# Patient Record
Sex: Male | Born: 1990 | Race: Black or African American | Hispanic: No | Marital: Single | Smoking: Former smoker
Health system: Southern US, Community
[De-identification: ages and names within clinical notes are randomized; demographics above are authoritative.]

## PROBLEM LIST (undated history)

## (undated) HISTORY — PX: ABDOMINAL SURGERY: SHX537

## (undated) HISTORY — PX: HERNIA REPAIR: SHX51

---
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
## 2009-11-07 ENCOUNTER — Emergency Department (HOSPITAL_COMMUNITY): Admission: EM | Admit: 2009-11-07 | Discharge: 2009-11-07 | Payer: Self-pay | Admitting: Family Medicine

## 2009-12-02 ENCOUNTER — Emergency Department (HOSPITAL_COMMUNITY): Admission: EM | Admit: 2009-12-02 | Discharge: 2009-12-02 | Payer: Self-pay | Admitting: Family Medicine

## 2010-02-20 ENCOUNTER — Emergency Department (HOSPITAL_COMMUNITY)
Admission: EM | Admit: 2010-02-20 | Discharge: 2010-02-20 | Payer: Self-pay | Source: Home / Self Care | Admitting: Emergency Medicine

## 2010-04-24 ENCOUNTER — Emergency Department (HOSPITAL_COMMUNITY): Payer: Self-pay

## 2010-04-24 ENCOUNTER — Emergency Department (HOSPITAL_COMMUNITY)
Admission: EM | Admit: 2010-04-24 | Discharge: 2010-04-25 | Disposition: A | Discharge status: Home / Self Care | Payer: Self-pay | Attending: Emergency Medicine | Admitting: Emergency Medicine

## 2010-04-24 DIAGNOSIS — S59909A Unspecified injury of unspecified elbow, initial encounter: Secondary | ICD-10-CM | POA: Insufficient documentation

## 2010-04-24 DIAGNOSIS — S6990XA Unspecified injury of unspecified wrist, hand and finger(s), initial encounter: Secondary | ICD-10-CM | POA: Insufficient documentation

## 2010-04-24 DIAGNOSIS — K219 Gastro-esophageal reflux disease without esophagitis: Secondary | ICD-10-CM | POA: Insufficient documentation

## 2010-04-24 DIAGNOSIS — Y92009 Unspecified place in unspecified non-institutional (private) residence as the place of occurrence of the external cause: Secondary | ICD-10-CM | POA: Insufficient documentation

## 2010-04-24 DIAGNOSIS — IMO0002 Reserved for concepts with insufficient information to code with codable children: Secondary | ICD-10-CM | POA: Insufficient documentation

## 2010-04-24 DIAGNOSIS — S5010XA Contusion of unspecified forearm, initial encounter: Secondary | ICD-10-CM | POA: Insufficient documentation

## 2010-04-28 LAB — GC/CHLAMYDIA PROBE AMP, URINE: Chlamydia, Swab/Urine, PCR: NEGATIVE

## 2010-05-25 LAB — GC/CHLAMYDIA PROBE AMP, GENITAL
Chlamydia, DNA Probe: NEGATIVE
GC Probe Amp, Genital: POSITIVE — AB

## 2010-06-28 NOTE — Op Note (Signed)
NAME:  Aaron Scott, Aaron Scott NO.:  [id number]   MEDICAL RECORD NO.:  [id number]           PATIENT TYPE:   LOCATION:                                 FACILITY:   PHYSICIAN:  Jon Gills, M.D.  DATE OF BIRTH:  [birth year]   DATE OF PROCEDURE:  05/17/2007  DATE OF DISCHARGE:                               OPERATIVE REPORT   PREOPERATIVE DIAGNOSIS:  Abdominal and chest pain.   POSTOPERATIVE DIAGNOSIS:  Nodular gastritis.   SURGEON:  Jon Gills, M.D.   ASSISTANT:  None.   DESCRIPTION OF PROCEDURE:  Following informed written consent, the  patient was taken to the operating room and placed under general  anesthesia with continuous cardiopulmonary monitoring.  Pentax upper GI  endoscope was passed by mouth and advanced without difficulty.  A  competent lower esophageal sphincter was present 40 cm from the  incisors.  There was no visual evidence for esophagitis, duodenitis, or  peptic ulcer disease.  Moderate nodularity was present in the gastric  antrum.  A solitary gastric biopsy was positive for Helicobacter by CLO  testing.  Multiple esophageal and duodenal biopsies were normal.  Gastric biopsies confirmed the presence of Helicobacter organisms.  The  endoscope was gradually withdrawn and the patient was awakened, taken to  recovery room in satisfactory condition.  He will be released later  today to the care of his family.  He was treated with a 10-day course of  Prevpac for his Helicobacter gastritis.   DESCRIPTION OF TECHNICAL PROCEDURE USED:  Pentax upper GI endoscope with  cold biopsy forceps.   DESCRIPTION OF SPECIMENS REMOVED:  Esophagus x3 in formalin, gastric x1  for CLO testing, gastric x3 in formalin, and duodenum x3 in formalin.           ______________________________  Jon Gills, M.D.     [facility]  D:  06/13/2007  T:  06/14/2007  Job:  [id number]   cc:   Fleet Contras, MD

## 2010-07-19 ENCOUNTER — Emergency Department (HOSPITAL_COMMUNITY): Payer: Self-pay

## 2010-07-19 ENCOUNTER — Inpatient Hospital Stay (HOSPITAL_COMMUNITY)
Admission: EM | Admit: 2010-07-19 | Discharge: 2010-07-21 | DRG: 603 | Disposition: A | Discharge status: Home / Self Care | Payer: Self-pay | Source: Ambulatory Visit | Attending: Orthopedic Surgery | Admitting: Orthopedic Surgery

## 2010-07-19 DIAGNOSIS — Z91040 Latex allergy status: Secondary | ICD-10-CM

## 2010-07-19 DIAGNOSIS — L02519 Cutaneous abscess of unspecified hand: Principal | ICD-10-CM | POA: Diagnosis present

## 2010-07-19 DIAGNOSIS — F1210 Cannabis abuse, uncomplicated: Secondary | ICD-10-CM | POA: Diagnosis present

## 2010-07-19 DIAGNOSIS — L03019 Cellulitis of unspecified finger: Principal | ICD-10-CM | POA: Diagnosis present

## 2010-07-19 DIAGNOSIS — F17200 Nicotine dependence, unspecified, uncomplicated: Secondary | ICD-10-CM | POA: Diagnosis present

## 2010-07-19 DIAGNOSIS — K219 Gastro-esophageal reflux disease without esophagitis: Secondary | ICD-10-CM | POA: Diagnosis present

## 2010-07-19 DIAGNOSIS — Z8614 Personal history of Methicillin resistant Staphylococcus aureus infection: Secondary | ICD-10-CM

## 2010-07-20 LAB — BASIC METABOLIC PANEL
BUN: 13 mg/dL (ref 6–23)
Calcium: 9.2 mg/dL (ref 8.4–10.5)
Chloride: 102 mEq/L (ref 96–112)
GFR calc Af Amer: >60 mL/min (ref >60)
Potassium: 3.9 mEq/L (ref 3.5–5.1)
Sodium: 136 mEq/L (ref 135–145)

## 2010-07-22 LAB — WOUND CULTURE

## 2010-08-01 NOTE — Op Note (Signed)
NAME:  Aaron, Scott NO.:  [id number]  MEDICAL RECORD NO.:  [id number]  LOCATION:  5002                         FACILITY:  [facility]  PHYSICIAN:  Aaron Scott, ScottDATE OF BIRTH:  [birth year]  DATE OF PROCEDURE: DATE OF DISCHARGE:                              OPERATIVE REPORT   I was asked to see Aaron Scott in regard to his upper extremity predicament.  This patient is a 20 year old male with a notable history of a left middle finger swelling and what appears to be an abscess. This started 2 days ago but cumulated in pain, difficulty, and problems functioning.  The patient and I discussed his upper extremity predicament options for treatment and I should note that he has had a history of boils according to his report.  He notes no history of obvious MRSA infection he is aware of.  PAST MEDICAL HISTORY:  None significant.  PAST SURGICAL HISTORY:  Hernia repair and endoscopy.  ALLERGIES:  LATEX.  MEDICINES:  None.  He smokes cigarettes and occasionally drinks alcohol.  He is here today with his girlfriend.  FAMILY HISTORY:  Noncontributory.  REVIEW OF SYSTEMS:  Negative other than the notable history of boils according to his report and the current upper extremity predicament involving his left middle finger.  PHYSICAL EXAMINATION:  HEENT:  Within normal limits. CHEST:  Clear. HEART:  Regular rate. ABDOMEN:  Nontender. EXTREMITIES:  Right upper extremity is neurovascularly intact.  Lower extremity examination is benign with normal neurovascular examination. No signs of infection, dystrophy, and no DVT.  The patient's left hand shows a swollen middle finger.  He has been numbed up by emergency room staff already, but does not have obvious Kanavel signs at this juncture. He has a callused area over the DIP crease volarly and this appears to be the area where his abnormality is most significant.  I have reviewed this with him at length and  the findings.  At the present time, he demonstrates intact flexor and extensor tendon function, but certainly is sore throughout the finger with erythema and cellulitis.  IMPRESSION:  Abscess, left middle finger.  PLAN:  I have consented him for I and D.  He was taken to the procedure suite and underwent a further block by myself followed by 2 Betadine scrubs followed by tourniquet inflation followed by I and D of the deep abscess.  This was performed without difficulty.  I and D of the deep abscess volarly about the middle finger was accomplished.  I did not go into the flexor sheath as the abscess did not appear to encroach upon this.  Nevertheless, I am going to watch this closely for any signs of seeding secondarily.  The wound was lavaged, packed with Iodoform gauze.  He was dressed sterilely. Following this, I discussed with him admission to the hospital, my plan for vancomycin IV, Unasyn, await his cultures which were taken with the deep abscess I and D and monitor his condition closely.  Should any worsening or problems arise, he will notify me.  It was an absolute pleasure to see him today.  Should any problems occur, he is going to notify me.  These notes have been discussed.  I  do feel that the best option for treatment is going to be an aggressive approach.  These notes have been discussed and all questions have been encouraged and answered.     Aaron Ano. Aaron Scott, M.D.     [facility]  D:  07/20/2010  T:  07/20/2010  Job:  [id number]  Electronically Signed by Aaron Scott 06:33:34 AM

## 2010-11-04 LAB — DIFFERENTIAL
Basophils Absolute: 0
Eosinophils Absolute: 0.3
Lymphocytes Relative: 30

## 2010-11-04 LAB — COMPREHENSIVE METABOLIC PANEL
ALT: 22
AST: 31
BUN: 12
CO2: 27
Creatinine, Ser: 0.94
Total Protein: 7.4

## 2010-11-04 LAB — CBC
Hemoglobin: 14.8
MCV: 83.9
RDW: 12.8

## 2010-11-07 LAB — URINALYSIS, ROUTINE W REFLEX MICROSCOPIC
Bilirubin Urine: NEGATIVE
Glucose, UA: NEGATIVE
Hgb urine dipstick: NEGATIVE
Hgb urine dipstick: NEGATIVE
Ketones, ur: NEGATIVE
Nitrite: NEGATIVE
Nitrite: NEGATIVE
Nitrite: NEGATIVE
Protein, ur: NEGATIVE
Specific Gravity, Urine: 1.007
Specific Gravity, Urine: 1.035 — ABNORMAL HIGH
Urobilinogen, UA: 1
pH: 6.5
pH: 7.5

## 2010-11-07 LAB — COMPREHENSIVE METABOLIC PANEL
ALT: 84 — ABNORMAL HIGH
AST: 141 — ABNORMAL HIGH
Alkaline Phosphatase: 83
CO2: 27
Calcium: 9.4
Chloride: 103
Glucose, Bld: 93
Sodium: 136
Total Bilirubin: 1

## 2010-11-07 LAB — HIV ANTIBODY (ROUTINE TESTING W REFLEX): HIV: NONREACTIVE

## 2010-11-07 LAB — DIFFERENTIAL
Basophils Absolute: 0
Basophils Absolute: 0
Basophils Relative: 0
Eosinophils Absolute: 0.1
Eosinophils Relative: 2
Lymphocytes Relative: 32
Lymphs Abs: 3.8
Monocytes Absolute: 0.9
Monocytes Relative: 16 — ABNORMAL HIGH
Neutro Abs: 2.9
Neutrophils Relative %: 48
Neutrophils Relative %: 36 — ABNORMAL LOW

## 2010-11-07 LAB — POCT CARDIAC MARKERS
CKMB, poc: 10.4
CKMB, poc: 11.6
Myoglobin, poc: 327
Operator id: 146091
Operator id: 270651
Troponin i, poc: <0.05

## 2010-11-07 LAB — BASIC METABOLIC PANEL
Calcium: 9.5
Sodium: 137

## 2010-11-07 LAB — D-DIMER, QUANTITATIVE: D-Dimer, Quant: 0.68 — ABNORMAL HIGH

## 2010-11-07 LAB — CBC
HCT: 39.8
Hemoglobin: 15
Hemoglobin: 13.4
Hemoglobin: 14.4
MCHC: 33.7
MCHC: 34.2
Platelets: 315
RBC: 5.25
RBC: 5.18
RDW: 12.3
WBC: 6.9

## 2010-11-07 LAB — RAPID URINE DRUG SCREEN, HOSP PERFORMED
Cocaine: NOT DETECTED
Tetrahydrocannabinol: NOT DETECTED

## 2010-11-07 LAB — MONONUCLEOSIS SCREEN: Mono Screen: NEGATIVE

## 2010-11-07 LAB — CK: Total CK: 2210 — ABNORMAL HIGH

## 2010-11-07 LAB — SEDIMENTATION RATE: Sed Rate: 4

## 2010-11-18 LAB — URINE MICROSCOPIC-ADD ON

## 2010-11-18 LAB — DIFFERENTIAL
Eosinophils Relative: 2 % (ref 0–5)
Lymphocytes Relative: 31 % (ref 12–46)
Lymphs Abs: 2.8 10*3/uL (ref 0.7–4.0)
Monocytes Absolute: 0.6 10*3/uL (ref 0.1–1.0)
Monocytes Relative: 7 % (ref 3–12)

## 2010-11-18 LAB — URINALYSIS, ROUTINE W REFLEX MICROSCOPIC
Glucose, UA: NEGATIVE mg/dL
Hgb urine dipstick: NEGATIVE
Specific Gravity, Urine: 1.033 — ABNORMAL HIGH (ref 1.005–1.030)
Urobilinogen, UA: 1 mg/dL (ref 0.0–1.0)

## 2010-11-18 LAB — CBC
MCHC: 33.8 g/dL (ref 30.0–36.0)
MCV: 81.3 fL (ref 78.0–100.0)
Platelets: 322 10*3/uL (ref 150–400)

## 2010-11-18 LAB — COMPREHENSIVE METABOLIC PANEL
ALT: 22 U/L (ref 0–53)
AST: 34 U/L (ref 0–37)
Albumin: 3.9 g/dL (ref 3.5–5.2)
Calcium: 9.4 mg/dL (ref 8.4–10.5)
Creatinine, Ser: 0.95 mg/dL (ref 0.4–1.5)
GFR calc Af Amer: >60 mL/min (ref >60)
Sodium: 140 mEq/L (ref 135–145)
Total Protein: 7.3 g/dL (ref 6.0–8.3)

## 2010-12-09 ENCOUNTER — Emergency Department (HOSPITAL_COMMUNITY)
Admission: EM | Admit: 2010-12-09 | Discharge: 2010-12-09 | Disposition: A | Discharge status: Home / Self Care | Payer: Self-pay | Attending: Emergency Medicine | Admitting: Emergency Medicine

## 2010-12-09 DIAGNOSIS — K219 Gastro-esophageal reflux disease without esophagitis: Secondary | ICD-10-CM | POA: Insufficient documentation

## 2010-12-09 DIAGNOSIS — R21 Rash and other nonspecific skin eruption: Secondary | ICD-10-CM | POA: Insufficient documentation

## 2010-12-09 DIAGNOSIS — A4902 Methicillin resistant Staphylococcus aureus infection, unspecified site: Secondary | ICD-10-CM | POA: Insufficient documentation

## 2011-01-17 ENCOUNTER — Encounter: Payer: Self-pay | Admitting: Emergency Medicine

## 2011-01-17 ENCOUNTER — Emergency Department (HOSPITAL_COMMUNITY): Payer: Self-pay

## 2011-01-17 ENCOUNTER — Emergency Department (HOSPITAL_COMMUNITY)
Admission: EM | Admit: 2011-01-17 | Discharge: 2011-01-17 | Disposition: A | Discharge status: Home / Self Care | Payer: Self-pay | Attending: Emergency Medicine | Admitting: Emergency Medicine

## 2011-01-17 DIAGNOSIS — W219XXA Striking against or struck by unspecified sports equipment, initial encounter: Secondary | ICD-10-CM | POA: Insufficient documentation

## 2011-01-17 DIAGNOSIS — M7989 Other specified soft tissue disorders: Secondary | ICD-10-CM | POA: Insufficient documentation

## 2011-01-17 DIAGNOSIS — S62309A Unspecified fracture of unspecified metacarpal bone, initial encounter for closed fracture: Secondary | ICD-10-CM | POA: Insufficient documentation

## 2011-01-17 DIAGNOSIS — Y9371 Activity, boxing: Secondary | ICD-10-CM | POA: Insufficient documentation

## 2011-01-17 MED ORDER — OXYCODONE-ACETAMINOPHEN 5-325 MG PO TABS
2.0000 | ORAL_TABLET | Freq: Once | ORAL | Status: AC
  Administered 2011-01-17: 2 via ORAL
  Filled 2011-01-17: qty 2

## 2011-01-17 MED ORDER — OXYCODONE-ACETAMINOPHEN 5-325 MG PO TABS: 2.0000 | ORAL_TABLET | Freq: Once | ORAL | Status: AC

## 2011-01-17 NOTE — ED Notes (Signed)
Cletis Athens, ortho tech at bedside.

## 2011-01-17 NOTE — ED Notes (Signed)
AC contacted, Ortho Tech paged by the secretary, waiting for the arrival

## 2011-01-17 NOTE — ED Notes (Signed)
Pt states that he was "playing around" boxing with the friend, pt states that he thinks that arm is broken since this happened before. Noted swelling to the right hand, pt able to move all digits, however, last 3 with difficulty, no deformity noted at this time, pain level 10/10. Pulses present even and regular bilaterally and cap ref <3

## 2011-01-17 NOTE — ED Notes (Signed)
Ortho tech arrived, pt informed

## 2011-01-17 NOTE — ED Provider Notes (Signed)
History     CSN: [id number] Arrival date & time: 01/17/2011  2:10 AM   First MD Initiated Contact with Patient 01/17/11 0457      Chief Complaint  Patient presents with  . Hand Injury    right  . Recurrent Skin Infections    (Consider location/radiation/quality/duration/timing/severity/associated sxs/prior treatment) Patient is a 20 y.o. male presenting with hand injury. The history is provided by the patient.  Hand Injury  The incident occurred 6 to 12 hours ago. The incident occurred at home. Injury mechanism: boxing with a friend and sustained a right hand iinjury. The pain is present in the right hand. The pain is moderate. The pain has been constant since the incident. Pertinent negatives include no fever. He reports no foreign bodies present. The symptoms are aggravated by movement. He has tried nothing for the symptoms.   no radiation of pain. Sharp in quality. Hurts to move or touch that area over the dorsum of his hand. No alleviating factors  History reviewed. No pertinent past medical history.  Past Surgical History  Procedure Date  . Abdominal surgery     History reviewed. No pertinent family history.  History  Substance Use Topics  . Smoking status: Current Some Day Smoker  . Smokeless tobacco: Not on file  . Alcohol Use: No      Review of Systems  Constitutional: Negative for fever and chills.  HENT: Negative for neck pain and neck stiffness.   Eyes: Negative for pain.  Respiratory: Negative for shortness of breath.   Cardiovascular: Negative for chest pain.  Gastrointestinal: Negative for abdominal pain.  Genitourinary: Negative for dysuria.  Musculoskeletal: Positive for joint swelling. Negative for back pain.  Skin: Negative for rash.  Neurological: Negative for headaches.  All other systems reviewed and are negative.    Allergies  Review of patient's allergies indicates no known allergies.  Home Medications  No current outpatient  prescriptions on file.  BP 120/55  Pulse 69  Temp(Src) 98.6 F (37 C) (Oral)  Resp 18  SpO2 98%  Physical Exam  Constitutional: He is oriented to person, place, and time. He appears well-developed and well-nourished.  HENT:  Head: Normocephalic and atraumatic.  Eyes: Conjunctivae and EOM are normal. Pupils are equal, round, and reactive to light.  Neck: Full passive range of motion without pain. Neck supple.  Cardiovascular: Normal rate, regular rhythm, S1 normal, S2 normal and intact distal pulses.   Pulmonary/Chest: Effort normal and breath sounds normal.  Abdominal: Soft. Bowel sounds are normal. There is no tenderness. There is no CVA tenderness.  Musculoskeletal:       Right upper extremity: Tenderness to palpation with swelling over the dorsum of the hand over the fourth carpal bone. Distal neurovascular is intact with mildly decreased range of motion making a fist secondary to pain. Skin is intact. Nontender over the wrist and elbow  Neurological: He is alert and oriented to person, place, and time. He has normal strength and normal reflexes. No cranial nerve deficit or sensory deficit. He displays a negative Romberg sign. GCS eye subscore is 4. GCS verbal subscore is 5. GCS motor subscore is 6.       Normal Gait  Skin: Skin is warm and dry. No rash noted. No cyanosis. Nails show no clubbing.  Psychiatric: He has a normal mood and affect. His speech is normal and behavior is normal.    ED Course  Procedures (including critical care time)  Labs Reviewed - No data to  display Dg Hand Complete Right  01/17/2011  *RADIOLOGY REPORT*  Clinical Data: Injured right hand while boxing, with pain at the fourth and fifth metacarpals, and associated soft tissue swelling.  RIGHT HAND - COMPLETE 3+ VIEW  Comparison: Right hand radiograph performed 06/07/2005  Findings: There is a minimally displaced fracture through the mid- diaphysis of the fourth metacarpal.  Slight volar tilt is noted. No  additional fractures are seen.  Overlying soft tissue swelling is noted.  The joint spaces are preserved.  The carpal rows are intact, and demonstrate normal alignment.  IMPRESSION: Minimally displaced fracture through the mid diaphysis of the fourth metacarpal, with slight volar tilt.  Original Report Authenticated By: Tonia Ghent, M.D.    Ice, pain control and imaging   MDM   Splint and hand referral for fracture as above.       Sunnie Nielsen, MD 01/17/11 [phone]

## 2011-01-17 NOTE — ED Notes (Signed)
Pt and family are apset since that delay is to long, pt wanted to speak with [facility], notified, AC at the bedside, pt complain is that Xray is taking to long to be read. Pt informe, verbalizing understanding.

## 2011-01-20 ENCOUNTER — Emergency Department (HOSPITAL_COMMUNITY)
Admission: EM | Admit: 2011-01-20 | Discharge: 2011-01-21 | Disposition: A | Discharge status: Home / Self Care | Payer: Self-pay | Attending: Emergency Medicine | Admitting: Emergency Medicine

## 2011-01-20 ENCOUNTER — Encounter (HOSPITAL_COMMUNITY): Payer: Self-pay | Admitting: Physical Medicine and Rehabilitation

## 2011-01-20 DIAGNOSIS — F17200 Nicotine dependence, unspecified, uncomplicated: Secondary | ICD-10-CM | POA: Insufficient documentation

## 2011-01-20 DIAGNOSIS — J020 Streptococcal pharyngitis: Secondary | ICD-10-CM | POA: Insufficient documentation

## 2011-01-20 DIAGNOSIS — R599 Enlarged lymph nodes, unspecified: Secondary | ICD-10-CM | POA: Insufficient documentation

## 2011-01-20 NOTE — ED Notes (Signed)
Pt presents to department for evaluation of sore throat. Ongoing x2 days. Pt states pain became worse today. Pain with swallowing and eating. States "I feel like something is in my throat." pt also states generalized fatigue. He is alert and oriented x4. No signs of distress at the time.

## 2011-01-21 ENCOUNTER — Encounter (HOSPITAL_COMMUNITY): Payer: Self-pay | Admitting: *Deleted

## 2011-01-21 MED ORDER — PREDNISONE 20 MG PO TABS
60.0000 mg | ORAL_TABLET | Freq: Once | ORAL | Status: AC
Start: 2011-01-21 — End: 2011-01-21
  Administered 2011-01-21: 60 mg via ORAL
  Filled 2011-01-21: qty 3

## 2011-01-21 MED ORDER — GI COCKTAIL ~~LOC~~
30.0000 mL | Freq: Once | ORAL | Status: AC
  Administered 2011-01-21: 30 mL via ORAL
  Filled 2011-01-21: qty 30

## 2011-01-21 MED ORDER — PENICILLIN G BENZATHINE & PROC 1200000 UNIT/2ML IM SUSP
1.2000 10*6.[IU] | Freq: Once | INTRAMUSCULAR | Status: AC
  Administered 2011-01-21: 1.2 10*6.[IU] via INTRAMUSCULAR
  Filled 2011-01-21: qty 2

## 2011-01-21 NOTE — ED Provider Notes (Signed)
Medical screening examination/treatment/procedure(s) were performed by non-physician practitioner and as supervising physician I was immediately available for consultation/collaboration.   Glynn Octave, MD 01/21/11 [phone]

## 2011-01-21 NOTE — ED Provider Notes (Signed)
History     CSN: [id number] Arrival date & time: 01/20/2011  9:46 PM   First MD Initiated Contact with Patient 01/21/11 0135      Chief Complaint  Patient presents with  . Sore Throat    HPI  History provided by the patient and significant other. Patient presents with complaints of increasing sore throat for the past 2 days. Patient denies cough, congestion, rhinorrhea, difficulty swallowing, difficulty breathing, nausea or vomiting. Patient does not report any known sick contacts. Patient denies any alleviating factors.  Pain is worse with swallowing. Patient has no other significant past medical history.  History reviewed. No pertinent past medical history.  Past Surgical History  Procedure Date  . Abdominal surgery     History reviewed. No pertinent family history.  History  Substance Use Topics  . Smoking status: Current Some Day Smoker  . Smokeless tobacco: Not on file  . Alcohol Use: No      Review of Systems  Constitutional: Negative for fever and chills.  HENT: Positive for sore throat. Negative for nosebleeds, congestion and dental problem.   Respiratory: Negative for cough and shortness of breath.   Gastrointestinal: Negative for nausea, vomiting, diarrhea and constipation.  All other systems reviewed and are negative.    Allergies  Latex  Home Medications   Current Outpatient Rx  Name Route Sig Dispense Refill  . OXYCODONE-ACETAMINOPHEN 5-325 MG PO TABS Oral Take 2 tablets by mouth once. 15 tablet 0    BP 130/79  Pulse 99  Temp(Src) 98.6 F (37 C) (Oral)  Resp 18  SpO2 99%  Physical Exam  Nursing note and vitals reviewed. Constitutional: He is oriented to person, place, and time. He appears well-developed and well-nourished. No distress.  HENT:  Head: Normocephalic.  Right Ear: External ear normal.  Left Ear: External ear normal.       Tonsils enlarged bilaterally with erythema and small amount of exudate. Uvula is midline no signs for PTA   Eyes: Conjunctivae and EOM are normal. Pupils are equal, round, and reactive to light.  Neck: Normal range of motion. Neck supple. No tracheal deviation present.  Cardiovascular: Normal rate, regular rhythm and normal heart sounds.   Pulmonary/Chest: Effort normal and breath sounds normal. He has no wheezes. He has no rales.  Abdominal: Soft. There is no tenderness.  Lymphadenopathy:    He has cervical adenopathy.  Neurological: He is alert and oriented to person, place, and time. No cranial nerve deficit.  Skin: Skin is warm. No rash noted.  Psychiatric: His behavior is normal.    ED Course  Procedures (including critical care time)  Labs Reviewed  RAPID STREP SCREEN - Abnormal; Notable for the following:    Streptococcus, Group A Screen (Direct) POSITIVE (*)    All other components within normal limits   No results found.   1. Strep throat      MDM  1:30 AM patient seen and evaluated. Patient in no acute distress. Patient with a positive strep test. He would like to have one time dose of penicillin IM.        Angus Seller, [place] 01/21/11 [phone]

## 2011-01-21 NOTE — ED Notes (Signed)
Pt reports sore throat and inability to swallow x couple days.  Denies fever.  Skin warm, dry and intact.  Neuro intact.

## 2011-02-12 ENCOUNTER — Emergency Department (HOSPITAL_COMMUNITY)
Admission: EM | Admit: 2011-02-12 | Discharge: 2011-02-13 | Disposition: A | Discharge status: Home / Self Care | Payer: Self-pay | Attending: Emergency Medicine | Admitting: Emergency Medicine

## 2011-02-12 ENCOUNTER — Encounter (HOSPITAL_COMMUNITY): Payer: Self-pay | Admitting: Emergency Medicine

## 2011-02-12 DIAGNOSIS — J029 Acute pharyngitis, unspecified: Secondary | ICD-10-CM

## 2011-02-12 DIAGNOSIS — R059 Cough, unspecified: Secondary | ICD-10-CM | POA: Insufficient documentation

## 2011-02-12 DIAGNOSIS — F17200 Nicotine dependence, unspecified, uncomplicated: Secondary | ICD-10-CM | POA: Insufficient documentation

## 2011-02-12 DIAGNOSIS — R070 Pain in throat: Secondary | ICD-10-CM | POA: Insufficient documentation

## 2011-02-12 DIAGNOSIS — R05 Cough: Secondary | ICD-10-CM | POA: Insufficient documentation

## 2011-02-12 DIAGNOSIS — H9209 Otalgia, unspecified ear: Secondary | ICD-10-CM | POA: Insufficient documentation

## 2011-02-12 NOTE — ED Notes (Signed)
C/o sore throat x 2 days.  Pt states he recently got antibiotic injection in ED for strep throat.

## 2011-02-13 NOTE — ED Provider Notes (Signed)
History     CSN: [id number]  Arrival date & time 02/12/11  2314   First MD Initiated Contact with Patient 02/13/11 0056      Chief Complaint  Patient presents with  . Sore Throat     Patient is a 20 y.o. male presenting with pharyngitis. The history is provided by the patient.  Sore Throat This is a new problem. The current episode started 2 days ago. The problem occurs constantly. The problem has not changed since onset.Pertinent negatives include no chest pain, no headaches and no shortness of breath. The symptoms are aggravated by swallowing. The symptoms are relieved by nothing.  +cough +ear pain No fever Reports last episode of strep throat had improved  PMH - none  Past Surgical History  Procedure Date  . Abdominal surgery     No family history on file.  History  Substance Use Topics  . Smoking status: Current Some Day Smoker  . Smokeless tobacco: Not on file  . Alcohol Use: No      Review of Systems  Respiratory: Negative for shortness of breath.   Cardiovascular: Negative for chest pain.  Neurological: Negative for headaches.    Allergies  Latex  Home Medications   Current Outpatient Rx  Name Route Sig Dispense Refill  . ACETAMINOPHEN 500 MG PO TABS Oral Take 1,000 mg by mouth every 6 (six) hours as needed. For pain       BP 131/65  Pulse 75  Temp(Src) 97.7 F (36.5 C) (Oral)  Resp 16  SpO2 94%  Physical Exam  CONSTITUTIONAL: Well developed/well nourished HEAD AND FACE: Normocephalic/atraumatic EYES: EOMI/PERRL ENMT: Mucous membranes moist, uvula midline, pharynx normal, no erythema Right TM normal, left TM occluded by cerumen NECK: supple no meningeal signs No cervical lymphadenopathy CV: S1/S2 noted, no murmurs/rubs/gallops noted LUNGS: Lungs are clear to auscultation bilaterally, no apparent distress ABDOMEN: soft, nontender, no rebound or guarding NEURO: Pt is awake/alert, moves all extremitiesx4 EXTREMITIES: pulses normal, full  ROM SKIN: warm, color normal PSYCH: no abnormalities of mood noted   ED Course  Procedures    1. Sore throat    Likely viral process   MDM  Nursing notes reviewed and considered in documentation Previous records reviewed and considered          Joya Gaskins, MD 02/13/11 [phone]

## 2011-04-23 ENCOUNTER — Encounter (HOSPITAL_COMMUNITY): Payer: Self-pay | Admitting: Emergency Medicine

## 2011-04-23 ENCOUNTER — Emergency Department (HOSPITAL_COMMUNITY)
Admission: EM | Admit: 2011-04-23 | Discharge: 2011-04-23 | Disposition: A | Discharge status: Home / Self Care | Payer: Self-pay | Attending: Emergency Medicine | Admitting: Emergency Medicine

## 2011-04-23 DIAGNOSIS — R21 Rash and other nonspecific skin eruption: Secondary | ICD-10-CM | POA: Insufficient documentation

## 2011-04-23 DIAGNOSIS — R12 Heartburn: Secondary | ICD-10-CM | POA: Insufficient documentation

## 2011-04-23 DIAGNOSIS — L739 Follicular disorder, unspecified: Secondary | ICD-10-CM

## 2011-04-23 DIAGNOSIS — K219 Gastro-esophageal reflux disease without esophagitis: Secondary | ICD-10-CM | POA: Insufficient documentation

## 2011-04-23 DIAGNOSIS — L738 Other specified follicular disorders: Secondary | ICD-10-CM | POA: Insufficient documentation

## 2011-04-23 DIAGNOSIS — R110 Nausea: Secondary | ICD-10-CM | POA: Insufficient documentation

## 2011-04-23 DIAGNOSIS — R1013 Epigastric pain: Secondary | ICD-10-CM | POA: Insufficient documentation

## 2011-04-23 MED ORDER — SULFAMETHOXAZOLE-TRIMETHOPRIM 800-160 MG PO TABS: 1.0000 | ORAL_TABLET | Freq: Two times a day (BID) | ORAL | Status: AC

## 2011-04-23 MED ORDER — OMEPRAZOLE 20 MG PO CPDR: 20.0000 mg | DELAYED_RELEASE_CAPSULE | Freq: Every day | ORAL | Status: DC

## 2011-04-23 NOTE — ED Notes (Signed)
Pt reports having boils on thighs and back reoccurred 1 week ago.

## 2011-04-23 NOTE — ED Provider Notes (Signed)
History     CSN: [id number]  Arrival date & time 04/23/11  1427   First MD Initiated Contact with Patient 04/23/11 1651      Chief Complaint  Patient presents with  . Rash     HPI This generally well young male presents with concerns over recurrent boils.  He notes similar episodes over the past few years.  He denies completion of any prior antibiotic course.  He notes that over the past week he's been, aware of a number of the radius, uncomfortable lesions on his right posterior thigh.  He denies any new fevers, chills, vomiting.  He denies any spreading erythema.  He has not attempted anything for symptom relief. The patient also complains of "heartburn" with positional epigastric discomfort and nausea.  He denies any vomiting. History reviewed. No pertinent past medical history.  Past Surgical History  Procedure Date  . Abdominal surgery   . Hernia repair     History reviewed. No pertinent family history.  History  Substance Use Topics  . Smoking status: Current Some Day Smoker -- 0.5 packs/day  . Smokeless tobacco: Not on file  . Alcohol Use: No      Review of Systems  All other systems reviewed and are negative.    Allergies  Latex  Home Medications   Current Outpatient Rx  Name Route Sig Dispense Refill  . ACETAMINOPHEN 500 MG PO TABS Oral Take 1,000 mg by mouth every 6 (six) hours as needed. For pain    . OMEPRAZOLE 20 MG PO CPDR Oral Take 1 capsule (20 mg total) by mouth daily. 20 capsule 0  . SULFAMETHOXAZOLE-TRIMETHOPRIM 800-160 MG PO TABS Oral Take 1 tablet by mouth every 12 (twelve) hours. 14 tablet 0    BP 122/56  Pulse 74  Temp(Src) 98 F (36.7 C) (Oral)  Resp 20  SpO2 98%  Physical Exam  Nursing note and vitals reviewed. Constitutional: He is oriented to person, place, and time. He appears well-developed. No distress.  HENT:  Head: Normocephalic and atraumatic.  Eyes: Conjunctivae and EOM are normal.  Cardiovascular: Normal rate and  regular rhythm.   Pulmonary/Chest: Effort normal. No stridor. No respiratory distress.  Abdominal: He exhibits no distension.  Musculoskeletal: He exhibits no edema.  Neurological: He is alert and oriented to person, place, and time.  Skin: Skin is warm and dry.     Psychiatric: He has a normal mood and affect.    ED Course  Procedures (including critical care time)  Labs Reviewed - No data to display No results found.   1. Folliculitis   2. GERD (gastroesophageal reflux disease)       MDM  This generally well young male now presents with concerns over recurrent skin lesions and heartburn.  On exam the patient is in no distress.  There is no identifiable drainable lesions.  I discussed the utility of both completing a course of antibiotics as well as the need for additional primary care management of recurrent lesions, with consideration of skin cleansing routine.  Given the patient's current complaint of epigastric discomfort and heartburn, he was provided Prilosec as well as antibiotics.   Gerhard Munch, MD 04/23/11 [phone]

## 2011-04-28 ENCOUNTER — Encounter (HOSPITAL_COMMUNITY): Payer: Self-pay | Admitting: Emergency Medicine

## 2011-04-28 ENCOUNTER — Emergency Department (HOSPITAL_COMMUNITY)
Admission: EM | Admit: 2011-04-28 | Discharge: 2011-04-28 | Disposition: A | Discharge status: Home / Self Care | Payer: Self-pay | Attending: Emergency Medicine | Admitting: Emergency Medicine

## 2011-04-28 ENCOUNTER — Emergency Department (HOSPITAL_COMMUNITY): Payer: Self-pay

## 2011-04-28 DIAGNOSIS — F17200 Nicotine dependence, unspecified, uncomplicated: Secondary | ICD-10-CM | POA: Insufficient documentation

## 2011-04-28 DIAGNOSIS — R51 Headache: Secondary | ICD-10-CM | POA: Insufficient documentation

## 2011-04-28 DIAGNOSIS — M542 Cervicalgia: Secondary | ICD-10-CM | POA: Insufficient documentation

## 2011-04-28 MED ORDER — DIAZEPAM 5 MG PO TABS
5.0000 mg | ORAL_TABLET | Freq: Once | ORAL | Status: AC
  Administered 2011-04-28: 5 mg via ORAL
  Filled 2011-04-28: qty 1

## 2011-04-28 MED ORDER — HYDROCODONE-ACETAMINOPHEN 5-325 MG PO TABS: 1.0000 | ORAL_TABLET | Freq: Four times a day (QID) | ORAL | Status: AC | PRN

## 2011-04-28 MED ORDER — OXYCODONE-ACETAMINOPHEN 5-325 MG PO TABS
1.0000 | ORAL_TABLET | Freq: Once | ORAL | Status: AC
  Administered 2011-04-28: 1 via ORAL
  Filled 2011-04-28: qty 1

## 2011-04-28 MED ORDER — DIAZEPAM 5 MG PO TABS: 5.0000 mg | ORAL_TABLET | Freq: Three times a day (TID) | ORAL | Status: AC | PRN

## 2011-04-28 MED ORDER — IBUPROFEN 800 MG PO TABS
800.0000 mg | ORAL_TABLET | Freq: Once | ORAL | Status: AC
  Administered 2011-04-28: 800 mg via ORAL
  Filled 2011-04-28: qty 1

## 2011-04-28 MED ORDER — IBUPROFEN 800 MG PO TABS: 800.0000 mg | ORAL_TABLET | Freq: Three times a day (TID) | ORAL | Status: AC | PRN
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
## 2011-04-28 NOTE — ED Provider Notes (Signed)
Medical screening examination/treatment/procedure(s) were performed by non-physician practitioner and as supervising physician I was immediately available for consultation/collaboration.   Bonnye Halle, MD 04/28/11 2325 
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
## 2011-06-30 ENCOUNTER — Encounter (HOSPITAL_COMMUNITY): Payer: Self-pay | Admitting: Emergency Medicine

## 2011-06-30 ENCOUNTER — Emergency Department (HOSPITAL_COMMUNITY)
Admission: EM | Admit: 2011-06-30 | Discharge: 2011-06-30 | Disposition: A | Discharge status: Home / Self Care | Payer: Self-pay | Attending: Emergency Medicine | Admitting: Emergency Medicine

## 2011-06-30 DIAGNOSIS — H6120 Impacted cerumen, unspecified ear: Secondary | ICD-10-CM | POA: Insufficient documentation

## 2011-06-30 DIAGNOSIS — H9209 Otalgia, unspecified ear: Secondary | ICD-10-CM | POA: Insufficient documentation

## 2011-06-30 DIAGNOSIS — Z7982 Long term (current) use of aspirin: Secondary | ICD-10-CM | POA: Insufficient documentation

## 2011-06-30 DIAGNOSIS — H6122 Impacted cerumen, left ear: Secondary | ICD-10-CM

## 2011-06-30 DIAGNOSIS — Z79899 Other long term (current) drug therapy: Secondary | ICD-10-CM | POA: Insufficient documentation

## 2011-06-30 DIAGNOSIS — Z9889 Other specified postprocedural states: Secondary | ICD-10-CM | POA: Insufficient documentation

## 2011-06-30 DIAGNOSIS — F17200 Nicotine dependence, unspecified, uncomplicated: Secondary | ICD-10-CM | POA: Insufficient documentation

## 2011-06-30 MED ORDER — ANTIPYRINE-BENZOCAINE 5.4-1.4 % OT SOLN
3.0000 [drp] | Freq: Once | OTIC | Status: AC
  Administered 2011-06-30: 4 [drp] via OTIC
  Filled 2011-06-30: qty 10

## 2011-06-30 MED ORDER — CARBAMIDE PEROXIDE 6.5 % OT SOLN: 5.0000 [drp] | Freq: Two times a day (BID) | OTIC | Status: AC

## 2011-06-30 MED ORDER — ANTIPYRINE-BENZOCAINE 5.4-1.4 % OT SOLN: 3.0000 [drp] | OTIC | Status: AC | PRN

## 2011-06-30 NOTE — ED Provider Notes (Signed)
Medical screening examination/treatment/procedure(s) were performed by non-physician practitioner and as supervising physician I was immediately available for consultation/collaboration.  Garin Mata, MD 06/30/11 2356 

## 2011-06-30 NOTE — ED Notes (Signed)
Pt reports having L ear pain starting yesterday, has gotten worse; pt has buildup of ear wax in ear on inspection

## 2011-06-30 NOTE — ED Provider Notes (Signed)
History     CSN: [id number]  Arrival date & time 06/30/11  6283   First MD Initiated Contact with Patient 06/30/11 2012      Chief Complaint  Patient presents with  . Otalgia    (Consider location/radiation/quality/duration/timing/severity/associated sxs/prior treatment) HPI  Patient presents to the ED with complaints of otalgia in his left ear. The pain started yesterday but has gotten a lot worse today, he does not have sore throat. Denise fever, difficulty swallowing, no other associated symptoms.   History reviewed. No pertinent past medical history.  Past Surgical History  Procedure Date  . Abdominal surgery   . Hernia repair     History reviewed. No pertinent family history.  History  Substance Use Topics  . Smoking status: Current Some Day Smoker -- 0.5 packs/day    Types: Cigarettes  . Smokeless tobacco: Not on file  . Alcohol Use: Yes     occasion      Review of Systems   HEENT: denies blurry vision or change in hearing PULMONARY: Denies difficulty breathing and SOB CARDIAC: denies chest pain or heart palpitations MUSCULOSKELETAL:  denies being unable to ambulate ABDOMEN AL: denies abdominal pain GU: denies loss of bowel or urinary control NEURO: denies numbness and tingling in extremities   Allergies  Latex  Home Medications   Current Outpatient Rx  Name Route Sig Dispense Refill  . ASPIRIN EC PO Oral Take 2 tablets by mouth once.    . ANTIPYRINE-BENZOCAINE 5.4-1.4 % OT SOLN Left Ear Place 3 drops into the left ear every 2 (two) hours as needed for pain. 10 mL 0  . CARBAMIDE PEROXIDE 6.5 % OT SOLN Both Ears Place 5 drops into both ears 2 (two) times daily. 15 mL 0    BP 145/71  Pulse 105  Temp(Src) 98.7 F (37.1 C) (Oral)  Resp 16  SpO2 97%  Physical Exam  Nursing note and vitals reviewed. Constitutional: He appears well-developed and well-nourished. No distress.  HENT:  Head: Normocephalic and atraumatic.       Right ear canal has  mild amount of ear wax Left ear canal has cerumen impaction.  Ear lavaged and afterwards portion of TM visualized with no injection or perforation.  Eyes: Pupils are equal, round, and reactive to light.  Neck: Normal range of motion. Neck supple.  Cardiovascular: Normal rate and regular rhythm.   Pulmonary/Chest: Effort normal.  Abdominal: Soft.  Neurological: He is alert.  Skin: Skin is warm and dry.    ED Course  Procedures (including critical care time)  Labs Reviewed - No data to display No results found.   1. Cerumen impaction, left       MDM  Ear lavage done by nurse, pt has felt some relief. Rx Debrox and Arulgan drops. Referral to ENT given.  Pt has been advised of the symptoms that warrant their return to the ED. Patient has voiced understanding and has agreed to follow-up with the PCP or specialist.         Dorthula Matas, PA 06/30/11 2144

## 2012-06-12 ENCOUNTER — Encounter (HOSPITAL_COMMUNITY): Payer: Self-pay

## 2012-06-12 ENCOUNTER — Emergency Department (HOSPITAL_COMMUNITY)
Admission: EM | Admit: 2012-06-12 | Discharge: 2012-06-12 | Disposition: A | Discharge status: Home / Self Care | Payer: Self-pay | Attending: Emergency Medicine | Admitting: Emergency Medicine

## 2012-06-12 DIAGNOSIS — J020 Streptococcal pharyngitis: Secondary | ICD-10-CM | POA: Insufficient documentation

## 2012-06-12 DIAGNOSIS — R1310 Dysphagia, unspecified: Secondary | ICD-10-CM | POA: Insufficient documentation

## 2012-06-12 DIAGNOSIS — F17200 Nicotine dependence, unspecified, uncomplicated: Secondary | ICD-10-CM | POA: Insufficient documentation

## 2012-06-12 MED ORDER — NAPROXEN 500 MG PO TABS: 500.0000 mg | ORAL_TABLET | Freq: Two times a day (BID) | ORAL | Status: DC

## 2012-06-12 MED ORDER — PENICILLIN G BENZATHINE 1200000 UNIT/2ML IM SUSP
1.2000 10*6.[IU] | Freq: Once | INTRAMUSCULAR | Status: AC
  Administered 2012-06-12: 1.2 10*6.[IU] via INTRAMUSCULAR
  Filled 2012-06-12: qty 2

## 2012-06-12 MED ORDER — PREDNISONE 20 MG PO TABS: 20.0000 mg | ORAL_TABLET | Freq: Every day | ORAL | Status: DC

## 2012-06-12 NOTE — ED Provider Notes (Signed)
History     CSN: [id number]  Arrival date & time 06/12/12  0001   First MD Initiated Contact with Patient 06/12/12 [id number]      Chief Complaint  Patient presents with  . Sore Throat    (Consider location/radiation/quality/duration/timing/severity/associated sxs/prior treatment) Patient is a 22 y.o. male presenting with pharyngitis.  Sore Throat This is a new problem. The current episode started today. The problem occurs constantly. The problem has been gradually worsening. Associated symptoms include a sore throat. Pertinent negatives include no abdominal pain, anorexia, arthralgias, change in bowel habit, chest pain, chills, congestion, coughing, diaphoresis, fatigue, fever, headaches, joint swelling, myalgias, nausea, neck pain, numbness, rash, swollen glands, urinary symptoms, vertigo, visual change, vomiting or weakness. The symptoms are aggravated by swallowing. He has tried nothing for the symptoms. The treatment provided no relief.    History reviewed. No pertinent past medical history.  Past Surgical History  Procedure Laterality Date  . Abdominal surgery    . Hernia repair      History reviewed. No pertinent family history.  History  Substance Use Topics  . Smoking status: Current Some Day Smoker -- 0.50 packs/day    Types: Cigarettes  . Smokeless tobacco: Not on file  . Alcohol Use: Yes     Comment: occasion      Review of Systems  Constitutional: Negative for fever, chills, diaphoresis and fatigue.  HENT: Positive for sore throat. Negative for congestion and neck pain.   Respiratory: Negative for cough.   Cardiovascular: Negative for chest pain.  Gastrointestinal: Negative for nausea, vomiting, abdominal pain, anorexia and change in bowel habit.  Musculoskeletal: Negative for myalgias, joint swelling and arthralgias.  Skin: Negative for rash.  Neurological: Negative for vertigo, weakness, numbness and headaches.  All other systems reviewed and are  negative.    Allergies  Latex  Home Medications  No current outpatient prescriptions on file.  BP 134/78  Pulse 82  Temp(Src) 97.7 F (36.5 C) (Oral)  Resp 18  SpO2 98%  Physical Exam  Nursing note and vitals reviewed. Constitutional: He is oriented to person, place, and time. He appears well-developed and well-nourished. No distress.  HENT:  Head: Normocephalic and atraumatic. No trismus in the jaw.  Right Ear: Tympanic membrane, external ear and ear canal normal.  Left Ear: Tympanic membrane, external ear and ear canal normal.  Nose: Nose normal. No rhinorrhea. Right sinus exhibits no maxillary sinus tenderness and no frontal sinus tenderness. Left sinus exhibits no maxillary sinus tenderness and no frontal sinus tenderness.  Mouth/Throat: Uvula is midline and mucous membranes are normal. Normal dentition. No dental abscesses or edematous. Oropharyngeal exudate and posterior oropharyngeal edema present. No posterior oropharyngeal erythema or tonsillar abscesses.  No submental edema, tongue not elevated, no trismus. No impending airway obstruction; Pt able to speak full sentences, swallow intact, no drooling, stridor, or tonsillar/uvula displacement. No palatal petechia  Eyes: Conjunctivae are normal.  Neck: Trachea normal, normal range of motion and full passive range of motion without pain. Neck supple. No rigidity. Normal range of motion present. No Brudzinski's sign noted.  Flexion and extension of neck without pain or difficulty. Able to breath without difficulty in extension.  Cardiovascular: Normal rate and regular rhythm.   Pulmonary/Chest: Effort normal and breath sounds normal. No stridor. No respiratory distress. He has no wheezes.  Abdominal: Soft. There is no tenderness.  No obvious evidence of splenomegaly. Non ttp.   Musculoskeletal: Normal range of motion.  Lymphadenopathy:  Head (right side): No preauricular and no posterior auricular adenopathy present.        Head (left side): No preauricular and no posterior auricular adenopathy present.    He has cervical adenopathy.  Neurological: He is alert and oriented to person, place, and time.  Skin: Skin is warm and dry. No rash noted. He is not diaphoretic.  Psychiatric: He has a normal mood and affect.    ED Course  Procedures (including critical care time)  Labs Reviewed  RAPID STREP SCREEN - Abnormal; Notable for the following:    Streptococcus, Group A Screen (Direct) POSITIVE (*)    All other components within normal limits   No results found.   No diagnosis found.    MDM  Pt afebrile with tonsillar exudate, cervical lymphadenopathy, & dysphagia + rapid strep; diagnosis of strep. Treated in the Ed with PCN IM.  Pt appears mildly dehydrated, discussed importance of water rehydration. Presentation non concerning for PTA or infxn spread to soft tissue. No trismus or uvula deviation. Specific return precautions discussed. Pt able to drink water in ED without difficulty with intact air way. Recommended PCP follow up.          Jaci Carrel, [place] 06/12/12 0120

## 2012-06-12 NOTE — ED Notes (Signed)
Pt c/o sore throat starting tonight, increase pain with swallowing, states his brother had a sore throat last week.

## 2012-06-21 NOTE — ED Provider Notes (Signed)
Medical screening examination/treatment/procedure(s) were performed by non-physician practitioner and as supervising physician I was immediately available for consultation/collaboration.   Aayla Marrocco, MD 06/21/12 0513 
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
## 2013-05-05 ENCOUNTER — Emergency Department (HOSPITAL_COMMUNITY)
Admission: EM | Admit: 2013-05-05 | Discharge: 2013-05-05 | Disposition: A | Discharge status: Home / Self Care | Payer: Self-pay | Attending: Emergency Medicine | Admitting: Emergency Medicine

## 2013-05-05 ENCOUNTER — Encounter (HOSPITAL_COMMUNITY): Payer: Self-pay | Admitting: Emergency Medicine

## 2013-05-05 DIAGNOSIS — Z8614 Personal history of Methicillin resistant Staphylococcus aureus infection: Secondary | ICD-10-CM | POA: Insufficient documentation

## 2013-05-05 DIAGNOSIS — L02415 Cutaneous abscess of right lower limb: Secondary | ICD-10-CM

## 2013-05-05 DIAGNOSIS — L03019 Cellulitis of unspecified finger: Secondary | ICD-10-CM | POA: Insufficient documentation

## 2013-05-05 DIAGNOSIS — L02419 Cutaneous abscess of limb, unspecified: Secondary | ICD-10-CM | POA: Insufficient documentation

## 2013-05-05 DIAGNOSIS — Z91040 Latex allergy status: Secondary | ICD-10-CM | POA: Insufficient documentation

## 2013-05-05 DIAGNOSIS — L02519 Cutaneous abscess of unspecified hand: Secondary | ICD-10-CM | POA: Insufficient documentation

## 2013-05-05 DIAGNOSIS — Z79899 Other long term (current) drug therapy: Secondary | ICD-10-CM | POA: Insufficient documentation

## 2013-05-05 DIAGNOSIS — L02416 Cutaneous abscess of left lower limb: Secondary | ICD-10-CM

## 2013-05-05 DIAGNOSIS — L03119 Cellulitis of unspecified part of limb: Principal | ICD-10-CM

## 2013-05-05 DIAGNOSIS — F17200 Nicotine dependence, unspecified, uncomplicated: Secondary | ICD-10-CM | POA: Insufficient documentation

## 2013-05-05 MED ORDER — TRAMADOL HCL 50 MG PO TABS: 50.0000 mg | ORAL_TABLET | Freq: Four times a day (QID) | ORAL | Status: DC | PRN

## 2013-05-05 MED ORDER — SULFAMETHOXAZOLE-TRIMETHOPRIM 800-160 MG PO TABS: 1.0000 | ORAL_TABLET | Freq: Two times a day (BID) | ORAL | Status: DC

## 2013-05-05 NOTE — ED Notes (Signed)
Pt sts he keeps getting boils on body . Last time was here had to have them lanced, today has sore on left thumb and legs.

## 2013-05-05 NOTE — Discharge Instructions (Signed)
Take the prescribed medication as directed.  May take tramadol with tylenol for added relief. [facility]up with [facility] for management of recurrent abscesses. Return to the ED for new or worsening symptoms.
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
## 2013-05-05 NOTE — ED Provider Notes (Signed)
CSN: [id number]     Arrival date & time 05/05/13  1247 History  This chart was scribed for non-physician practitioner, Sharilyn Sites, PA-C, working with Audree Camel, MD, by Ellin Mayhew, ED Scribe. This patient was seen in room TR05C/TR05C and the patient's care was started at 1:55 PM.   The history is provided by the patient. No language interpreter was used.   HPI Comments: Aaron Scott is a 23 y.o.  male who presents to the Emergency Department with a chief complaint of recurrent boils on the L thumb and bilateral medial thighs.  Patient reports having had similar boils to the abdomen and hands which required I&D in the past.  Currently, he presents with several small boils that are painful to touch.  Pt states he has a hx of recurrent boils to his thighs and has chronic discoloration and scarring of these areas, present for the past several years.  Denies recent fevers or chills.  Pt has hx of MRSA.  No intervention tried PTA.  History reviewed. No pertinent past medical history. Past Surgical History  Procedure Laterality Date  . Abdominal surgery    . Hernia repair     History reviewed. No pertinent family history. History  Substance Use Topics  . Smoking status: Current Some Day Smoker -- 0.50 packs/day    Types: Cigarettes  . Smokeless tobacco: Not on file  . Alcohol Use: Yes     Comment: occasion    Review of Systems  Constitutional: Negative for fever, chills, activity change, appetite change and unexpected weight change.  HENT: Negative for congestion and sore throat.   Respiratory: Negative for cough and shortness of breath.   Cardiovascular: Negative for chest pain.  Gastrointestinal: Negative for nausea, vomiting, abdominal pain and diarrhea.  Genitourinary: Negative for dysuria and hematuria.  Musculoskeletal: Negative for arthralgias.  Skin:       Small boils diffusely over medial thighs  Neurological: Negative for weakness.  All other systems reviewed and  are negative.   Allergies  Latex  Home Medications   Current Outpatient Rx  Name  Route  Sig  Dispense  Refill  . naproxen (NAPROSYN) 500 MG tablet   Oral   Take 1 tablet (500 mg total) by mouth 2 (two) times daily.   30 tablet   0   . predniSONE (DELTASONE) 20 MG tablet   Oral   Take 1 tablet (20 mg total) by mouth daily. Take 3 tabs day 1, 2 tabs day 2 & 3, then 1 tab day 4-7   11 tablet   0    Triage Vitals: BP 130/76  Pulse 85  Temp(Src) 97.8 F (36.6 C) (Oral)  Resp 20  Ht 5\' 6"  (1.676 m)  Wt 240 lb (108.863 kg)  BMI 38.76 kg/m2  SpO2 96%  Physical Exam  Nursing note and vitals reviewed. Constitutional: He is oriented to person, place, and time. He appears well-developed and well-nourished. No distress.  HENT:  Head: Normocephalic and atraumatic.  Mouth/Throat: Oropharynx is clear and moist.  Eyes: Conjunctivae and EOM are normal. Pupils are equal, round, and reactive to light.  Neck: Normal range of motion. Neck supple.  Cardiovascular: Normal rate, regular rhythm and normal heart sounds.   Pulmonary/Chest: Effort normal and breath sounds normal. No respiratory distress. He has no wheezes.  Musculoskeletal: Normal range of motion. He exhibits no edema.  Small callus to left thumb at volar surface of IP joint  Neurological: He is alert and oriented to  person, place, and time.  Skin: Skin is warm and dry. He is not diaphoretic.  Numerous small abscesses to bilateral medial thighs; no central fluctuance, drainage, or cellulitic changes; no warmth to touch; chronic hyperpigmentation of groin and upper medial thighs  Psychiatric: He has a normal mood and affect.    ED Course  Procedures (including critical care time)  DIAGNOSTIC STUDIES: Oxygen Saturation is 96% on room air, adequate by my interpretation.    COORDINATION OF CARE: 2:03 PM-Reviewed prior notes and discussed the likelihood of recurring MRSA infections. Advised patient he would need a high dose  of antibiotics followed by regular maintenance of infection to avoid large abscess formation and worsening symptoms. [facility].Treatment plan discussed with patient and patient agrees.  Labs Review Labs Reviewed - No data to display Imaging Review No results found.   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  Abscesses are all small without central fluctuance, drainage, or surrounding cellulitic changes-- I&D deferred at this time.  Pt does have chronic discoloration and scarring of his medial thighs which is unchanged from his baseline.  Will start on bactrim x2 weeks, encouraged warm compresses.  Pt [facility] for re-check and further management-- likely will need maintenance abx after this course has finished.  Discussed plan with pt, they agreed.  Return precautions advised.  I personally performed the services described in this documentation, which was scribed in my presence. The recorded information has been reviewed and is accurate.  Garlon HatchetLisa M Shai Mckenzie, PA-C 05/05/13 1616

## 2013-05-08 NOTE — ED Provider Notes (Signed)
Medical screening examination/treatment/procedure(s) were performed by non-physician practitioner and as supervising physician I was immediately available for consultation/collaboration.   EKG Interpretation None        Audree CamelScott T Stancil Deisher, MD 05/08/13 [phone]

## 2013-05-22 ENCOUNTER — Encounter (HOSPITAL_COMMUNITY): Payer: Self-pay | Admitting: Emergency Medicine

## 2013-05-22 ENCOUNTER — Emergency Department (HOSPITAL_COMMUNITY)
Admission: EM | Admit: 2013-05-22 | Discharge: 2013-05-22 | Disposition: A | Discharge status: Home / Self Care | Payer: Self-pay | Attending: Emergency Medicine | Admitting: Emergency Medicine

## 2013-05-22 DIAGNOSIS — Z87891 Personal history of nicotine dependence: Secondary | ICD-10-CM | POA: Insufficient documentation

## 2013-05-22 DIAGNOSIS — Z8619 Personal history of other infectious and parasitic diseases: Secondary | ICD-10-CM | POA: Insufficient documentation

## 2013-05-22 DIAGNOSIS — R300 Dysuria: Secondary | ICD-10-CM | POA: Insufficient documentation

## 2013-05-22 DIAGNOSIS — Z792 Long term (current) use of antibiotics: Secondary | ICD-10-CM | POA: Insufficient documentation

## 2013-05-22 DIAGNOSIS — Z91040 Latex allergy status: Secondary | ICD-10-CM | POA: Insufficient documentation

## 2013-05-22 DIAGNOSIS — R369 Urethral discharge, unspecified: Secondary | ICD-10-CM | POA: Insufficient documentation

## 2013-05-22 MED ORDER — CEFTRIAXONE SODIUM 250 MG IJ SOLR
250.0000 mg | Freq: Once | INTRAMUSCULAR | Status: AC
  Administered 2013-05-22: 250 mg via INTRAMUSCULAR
  Filled 2013-05-22: qty 250

## 2013-05-22 MED ORDER — LIDOCAINE HCL (PF) 1 % IJ SOLN
INTRAMUSCULAR | Status: AC
  Administered 2013-05-22: 0.5 mL via INTRAMUSCULAR
  Filled 2013-05-22: qty 5

## 2013-05-22 MED ORDER — AZITHROMYCIN 250 MG PO TABS
1000.0000 mg | ORAL_TABLET | Freq: Once | ORAL | Status: AC
  Administered 2013-05-22: 1000 mg via ORAL
  Filled 2013-05-22: qty 4

## 2013-05-22 NOTE — ED Notes (Addendum)
PT reports burning and yellow discharge with urination the past few days. States new sexual partner, but unknown hx of partners std status

## 2013-05-22 NOTE — Discharge Instructions (Signed)
Refrain from sexual intercourse for 7 days. Be sure to have all partners tested and treated for STDs, this may be done by the Primary care provider or health department.  Practice safe sex by always wearing condoms.

## 2013-05-22 NOTE — ED Notes (Signed)
MD at bedside. 

## 2013-05-22 NOTE — ED Provider Notes (Signed)
CSN: [id number]     Arrival date & time 05/22/13  1119 History   This chart was scribed for Aaron Finner, PA-C, working with Flint Melter, MD by Blanchard Kelch, ED Scribe. This patient was seen in room TR11C/TR11C and the patient's care was started at 12:58 PM.    Chief Complaint  Patient presents with  . Dysuria     Patient is a 23 y.o. male presenting with dysuria. The history is provided by the patient. No language interpreter was used.  Dysuria    HPI Comments: Aaron Scott is a 23 y.o. male who presents to the Emergency Department complaining of constant, moderate dysuria that began three days ago. He also reports intermittent yellowish penile discharge in the morning. He states that he has had unprotected sexual intercourse but does not know of any definite STD exposure. He denies fever, nausea or vomiting. He reports a history of STDs.  Pt states he was going to go to health department but they only had appointments at 10am and he overslept.    History reviewed. No pertinent past medical history. Past Surgical History  Procedure Laterality Date  . Abdominal surgery    . Hernia repair     History reviewed. No pertinent family history. History  Substance Use Topics  . Smoking status: Former Smoker -- 0.50 packs/day  . Smokeless tobacco: Not on file  . Alcohol Use: Yes     Comment: occasion    Review of Systems  Constitutional: Negative for fever.  Gastrointestinal: Negative for nausea and vomiting.  Genitourinary: Positive for dysuria and discharge.  All other systems reviewed and are negative.     Allergies  Latex  Home Medications   Current Outpatient Rx  Name  Route  Sig  Dispense  Refill  . ALPRAZolam (XANAX) 1 MG tablet   Oral   Take 1 mg by mouth once.         . sulfamethoxazole-trimethoprim (SEPTRA DS) 800-160 MG per tablet   Oral   Take 1 tablet by mouth every 12 (twelve) hours.   28 tablet   0    Triage Vitals: BP 160/78  Pulse 126   Temp(Src) 98.2 F (36.8 C) (Oral)  Resp 20  Ht 5\' 6"  (1.676 m)  Wt 240 lb (108.863 kg)  BMI 38.76 kg/m2  SpO2 100%  Physical Exam  Nursing note and vitals reviewed. Constitutional: He is oriented to person, place, and time. He appears well-developed and well-nourished.  HENT:  Head: Normocephalic and atraumatic.  Eyes: EOM are normal.  Neck: Normal range of motion.  Cardiovascular: Normal rate.   Pulmonary/Chest: Effort normal.  Genitourinary: Testes normal and penis normal. Right testis shows no mass, no swelling and no tenderness. Right testis is descended. Cremasteric reflex is not absent on the right side. Left testis shows no mass, no swelling and no tenderness. Left testis is descended. Cremasteric reflex is not absent on the left side. Circumcised. No penile tenderness. No discharge found.  Chaperone present for exam.   Musculoskeletal: Normal range of motion.  Neurological: He is alert and oriented to person, place, and time.  Skin: Skin is warm and dry.  Psychiatric: He has a normal mood and affect. His behavior is normal.    ED Course  Procedures (including critical care time)  DIAGNOSTIC STUDIES: Oxygen Saturation is 100% on room air, normal by my interpretation.    COORDINATION OF CARE: 1:02 PM -Will provide treatment for clinical suspicion of Gonorrhea/Chylamydia infection. Patient verbalizes understanding  and agrees with treatment plan.    Labs Review Labs Reviewed  GC/CHLAMYDIA PROBE AMP   Imaging Review No results found.   EKG Interpretation None      MDM   Final diagnoses:  Penile discharge  Dysuria    Pt tx empirically for GC/chlamydia.  Advised to f/u with PCP and/or health department for further testing and treatment of STDs. Encouraged safe sex.  Provided pt resource guide.   I personally performed the services described in this documentation, which was scribed in my presence. The recorded information has been reviewed and is  accurate.    Aaron Finnerrin O'Malley, PA-C 05/22/13 1427

## 2013-05-22 NOTE — ED Notes (Signed)
MD at bedside.-Erin PA at bedside. Performed specimen swab with Leialoha Hanna RN present in room

## 2013-05-22 NOTE — ED Provider Notes (Signed)
Medical screening examination/treatment/procedure(s) were performed by non-physician practitioner and as supervising physician I was immediately available for consultation/collaboration.  Leanor Voris L Mysha Peeler, MD 05/22/13 1716 

## 2013-05-22 NOTE — ED Notes (Signed)
He states "it burns every time i pee" for past 3 days

## 2013-05-23 LAB — GC/CHLAMYDIA PROBE AMP
CT Probe RNA: NEGATIVE
GC Probe RNA: NEGATIVE

## 2013-05-26 ENCOUNTER — Telehealth (HOSPITAL_BASED_OUTPATIENT_CLINIC_OR_DEPARTMENT_OTHER): Payer: Self-pay

## 2013-05-26 NOTE — Telephone Encounter (Signed)
Pt calling for STD results.  ID verified x 2.  Pt informed both Gonorrhea and Chlamydia are negative. 

## 2013-06-24 ENCOUNTER — Encounter (HOSPITAL_COMMUNITY): Payer: Self-pay | Admitting: Emergency Medicine

## 2013-06-24 ENCOUNTER — Emergency Department (INDEPENDENT_AMBULATORY_CARE_PROVIDER_SITE_OTHER)
Admission: EM | Admit: 2013-06-24 | Discharge: 2013-06-24 | Disposition: A | Discharge status: Home / Self Care | Payer: Self-pay | Source: Home / Self Care | Attending: Family Medicine | Admitting: Family Medicine

## 2013-06-24 ENCOUNTER — Other Ambulatory Visit (HOSPITAL_COMMUNITY)
Admission: RE | Admit: 2013-06-24 | Discharge: 2013-06-24 | Disposition: A | Discharge status: Home / Self Care | Payer: Self-pay | Source: Ambulatory Visit | Attending: Family Medicine | Admitting: Family Medicine

## 2013-06-24 DIAGNOSIS — R369 Urethral discharge, unspecified: Secondary | ICD-10-CM

## 2013-06-24 DIAGNOSIS — N342 Other urethritis: Secondary | ICD-10-CM

## 2013-06-24 DIAGNOSIS — Z113 Encounter for screening for infections with a predominantly sexual mode of transmission: Secondary | ICD-10-CM | POA: Insufficient documentation

## 2013-06-24 LAB — POCT URINALYSIS DIP (DEVICE)
Bilirubin Urine: NEGATIVE
Glucose, UA: NEGATIVE mg/dL
Hgb urine dipstick: NEGATIVE
Ketones, ur: NEGATIVE mg/dL
NITRITE: NEGATIVE
PROTEIN: 30 mg/dL — AB
Specific Gravity, Urine: 1.025 (ref 1.005–1.030)
UROBILINOGEN UA: 1 mg/dL (ref 0.0–1.0)
pH: 7 (ref 5.0–8.0)

## 2013-06-24 MED ORDER — CEFTRIAXONE SODIUM 250 MG IJ SOLR
1.0000 g | Freq: Once | INTRAMUSCULAR | Status: AC
  Administered 2013-06-24: 1 g via INTRAMUSCULAR

## 2013-06-24 MED ORDER — CEFTRIAXONE SODIUM 1 G IJ SOLR
INTRAMUSCULAR | Status: AC
  Filled 2013-06-24: qty 10

## 2013-06-24 MED ORDER — MINOCYCLINE HCL 100 MG PO CAPS: 100.0000 mg | ORAL_CAPSULE | Freq: Two times a day (BID) | ORAL | Status: DC

## 2013-06-24 MED ORDER — LIDOCAINE HCL (PF) 1 % IJ SOLN
INTRAMUSCULAR | Status: AC
  Filled 2013-06-24: qty 5

## 2013-06-24 NOTE — ED Provider Notes (Signed)
Medical screening examination/treatment/procedure(s) were performed by resident physician or non-physician practitioner and as supervising physician I was immediately available for consultation/collaboration.   Mahogany Torrance DOUGLAS MD.   Jessicamarie Amiri D Ahaana Rochette, MD 06/24/13 1717 

## 2013-06-24 NOTE — Discharge Instructions (Signed)
Urethritis, Adult  Urethritis is an inflammation of the tube through which urine exits your bladder (urethra).   CAUSES  Urethritis is often caused by an infection in your urethra. The infection can be viral, like herpes. The infection can also be bacterial, like gonorrhea.  RISK FACTORS  Risk factors of urethritis include:  · Having sex without using a condom.  · Having multiple sexual partners.  · Having poor hygiene.  SIGNS AND SYMPTOMS  Symptoms of urethritis are less noticeable in women than in men. These symptoms include:  · Burning feeling when you urinate (dysuria).  · Discharge from your urethra.  · Blood in your urine (hematuria).  · Urinating more than usual.  DIAGNOSIS   To confirm a diagnosis of urethritis, your health care provider will do the following:  · Ask about your sexual history.  · Perform a physical exam.  · Have you provide a sample of your urine for lab testing.  · Use a cotton swab to gently collect a sample from your urethra for lab testing.  TREATMENT   It is important to treat urethritis. Depending on the cause, untreated urethritis may lead to serious genital infections and possibly infertility. Urethritis caused by a bacterial infection is treated with antibiotics. All sexual partners must be treated.   HOME CARE INSTRUCTIONS  · Do not have sex until the test results are known and treatment is completed, even if your symptoms go away before you finish treatment.  · Finish all medicines that you are prescribed.  SEEK MEDICAL CARE IF:   · Your symptoms are not improved in 3 days.  · Your symptoms are getting worse.  · You develop abdominal pain or pelvic pain (in women).  · You develop joint pain.  SEEK IMMEDIATE MEDICAL CARE IF:   · You have a fever with a temperature of 101.8°F (38.8°C) or greater.  · You have severe pain in the belly, back, or side.  · You have repeated vomiting.  Document Released: 07/26/2000 Document Revised: 11/20/2012 Document Reviewed: 09/30/2012  [employer]®  Patient Information ©2014 [employer].

## 2013-06-24 NOTE — ED Notes (Signed)
Pt c/o yellow penile d/c onset 1 month [facility]; treated w/Rocephin and Azithromycin Reports STD results were negative but still having penile d/c Also c/o dysuria and urinary freq Denies f/v/n/d Alert w/no signs of acute distress.

## 2013-06-24 NOTE — ED Provider Notes (Signed)
CSN: [id number]     Arrival date & time 06/24/13  1042 History   First MD Initiated Contact with Patient 06/24/13 1204     Chief Complaint  Patient presents with  . Exposure to STD   (Consider location/radiation/quality/duration/timing/severity/associated sxs/prior Treatment) HPI Comments: 23 year old male presents complaining of penile discharge. He was seen in the emergency department approximately one month ago for this problem and was treated empirically for Chlamydia and gonorrhea. He got better for a couple of days but then his symptoms returned. He is having penile discharge with slight dysuria, and some lower abdominal pressure. He denies any genital lesions, hematuria, testicular pain, low back pain, nausea, vomiting. He does not feel sick. He denies any possibility of reinfection, he has not been sexually active since she was seen in the emergency department. His STD test at that time were all negative.  Patient is a 23 y.o. male presenting with STD exposure.  Exposure to STD    History reviewed. No pertinent past medical history. Past Surgical History  Procedure Laterality Date  . Abdominal surgery    . Hernia repair     No family history on file. History  Substance Use Topics  . Smoking status: Former Smoker -- 0.50 packs/day  . Smokeless tobacco: Not on file  . Alcohol Use: Yes     Comment: occasion    Review of Systems  Genitourinary: Positive for dysuria and discharge. Negative for urgency, frequency, flank pain, genital sores and testicular pain.  All other systems reviewed and are negative.   Allergies  Latex  Home Medications   Prior to Admission medications   Medication Sig Start Date End Date Taking? Authorizing Provider  ALPRAZolam Prudy Feeler(XANAX) 1 MG tablet Take 1 mg by mouth once.    Historical Provider, MD  sulfamethoxazole-trimethoprim (SEPTRA DS) 800-160 MG per tablet Take 1 tablet by mouth every 12 (twelve) hours. 05/05/13   Garlon HatchetLisa M Sanders, PA-C   BP  116/79  Pulse 58  Temp(Src) 97.4 F (36.3 C) (Oral)  Resp 20  SpO2 99% Physical Exam  Nursing note and vitals reviewed. Constitutional: He is oriented to person, place, and time. He appears well-developed and well-nourished. No distress.  HENT:  Head: Normocephalic.  Pulmonary/Chest: Effort normal. No respiratory distress.  Abdominal: Soft. He exhibits no mass. There is tenderness (minimal suprapubic tenderness). There is no rebound and no guarding.  Genitourinary: Testes normal and penis normal. Right testis shows no mass and no tenderness. Left testis shows no mass and no tenderness. Circumcised. No discharge found.  Lymphadenopathy:       Right: No inguinal adenopathy present.       Left: No inguinal adenopathy present.  Neurological: He is alert and oriented to person, place, and time. Coordination normal.  Skin: Skin is warm and dry. No rash noted. He is not diaphoretic.  Psychiatric: He has a normal mood and affect. Judgment normal.    ED Course  Procedures (including critical care time) Labs Review Labs Reviewed  POCT URINALYSIS DIP (DEVICE) - Abnormal; Notable for the following:    Protein, ur 30 (*)    Leukocytes, UA SMALL (*)    All other components within normal limits  URINE CULTURE  URINE CYTOLOGY ANCILLARY ONLY    Imaging Review No results found.   MDM   1. Penile discharge   2. Urethritis    No obvious abnormalities on exam, will treat based on his description of his symptoms.  Etiology includes simple urinary tract infection, non-Chlamydia  non-gonococcal urethritis, resistant gonococcal urethritis, or reinfection.  We'll treat with a higher dose of ceftriaxone to attempt to overcome any resistance. Will discharge with minocycline to cover not gonococcal urethritis, Chlamydia or other. Urine culture sent, however common urinary pathogens should be covered by the minocycline.  Will provide referral for urology, patient has been instructed to followup  there if he is not having resolution of symptoms with this treatment today.   Meds ordered this encounter  Medications  . cefTRIAXone (ROCEPHIN) injection 1 g    Sig:   . minocycline (MINOCIN) 100 MG capsule    Sig: Take 1 capsule (100 mg total) by mouth 2 (two) times daily.    Dispense:  14 capsule    Refill:  0    Order Specific Question:  Supervising Provider    Answer:  Bradd CanaryKINDL, JAMES D [[id number]]     Graylon GoodZachary H Briceyda Abdullah, PA-C 06/24/13 1223

## 2013-06-25 LAB — URINE CULTURE
Colony Count: NO GROWTH
Culture: NO GROWTH

## 2013-06-25 LAB — URINE CYTOLOGY ANCILLARY ONLY
CHLAMYDIA, DNA PROBE: NEGATIVE
Neisseria Gonorrhea: NEGATIVE
Trichomonas: NEGATIVE

## 2013-07-22 ENCOUNTER — Encounter (HOSPITAL_COMMUNITY): Payer: Self-pay | Admitting: Emergency Medicine

## 2013-07-22 ENCOUNTER — Emergency Department (HOSPITAL_COMMUNITY)
Admission: EM | Admit: 2013-07-22 | Discharge: 2013-07-22 | Disposition: A | Discharge status: Home / Self Care | Payer: Self-pay | Attending: Emergency Medicine | Admitting: Emergency Medicine

## 2013-07-22 DIAGNOSIS — Z87891 Personal history of nicotine dependence: Secondary | ICD-10-CM | POA: Insufficient documentation

## 2013-07-22 DIAGNOSIS — J069 Acute upper respiratory infection, unspecified: Secondary | ICD-10-CM | POA: Insufficient documentation

## 2013-07-22 DIAGNOSIS — J029 Acute pharyngitis, unspecified: Secondary | ICD-10-CM | POA: Insufficient documentation

## 2013-07-22 DIAGNOSIS — Z91040 Latex allergy status: Secondary | ICD-10-CM | POA: Insufficient documentation

## 2013-07-22 DIAGNOSIS — Z79899 Other long term (current) drug therapy: Secondary | ICD-10-CM | POA: Insufficient documentation

## 2013-07-22 DIAGNOSIS — IMO0001 Reserved for inherently not codable concepts without codable children: Secondary | ICD-10-CM | POA: Insufficient documentation

## 2013-07-22 NOTE — ED Provider Notes (Signed)
CSN: [id number]     Arrival date & time 07/22/13  1154 History  This chart was scribed for non-physician practitioner, Elpidio Anis, PA-C working with Suzi Roots, MD by Greggory Stallion, ED scribe. This patient was seen in room TR07C/TR07C and the patient's care was started at 12:16 PM.    Chief Complaint  Patient presents with  . Cough   The history is provided by the patient. No language interpreter was used.   HPI Comments: Prynce Barrozo is a 23 y.o. male who presents to the Emergency Department complaining of productive cough that started yesterday. States he is also having generalized body aches, congestion and sore throat. Pt has taken OTC cough syrup with some relief. 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 Pt smokes cigarettes daily.   History reviewed. No pertinent past medical history. Past Surgical History  Procedure Laterality Date  . Abdominal surgery    . Hernia repair     History reviewed. No pertinent family history. History  Substance Use Topics  . Smoking status: Former Smoker -- 0.50 packs/day  . Smokeless tobacco: Not on file  . Alcohol Use: Yes     Comment: occasion    Review of Systems  HENT: Positive for congestion and sore throat.   Respiratory: Positive for cough.   Musculoskeletal: Positive for myalgias.  All other systems reviewed and are negative.  Allergies  Latex  Home Medications   Prior to Admission medications   Medication Sig Start Date End Date Taking? Authorizing Provider  ALPRAZolam Prudy Feeler) 1 MG tablet Take 1 mg by mouth once.    Historical Provider, MD  minocycline (MINOCIN) 100 MG capsule Take 1 capsule (100 mg total) by mouth 2 (two) times daily. 06/24/13   Adrian Blackwater Baker, PA-C  sulfamethoxazole-trimethoprim (SEPTRA DS) 800-160 MG per tablet Take 1 tablet by mouth every 12 (twelve) hours. 05/05/13   Garlon Hatchet, PA-C   BP 165/70  Pulse 73  Temp(Src) 97.9 F (36.6 C) (Oral)  Wt 231 lb  (104.781 kg)  SpO2 99%  Physical Exam  Nursing note and vitals reviewed. Constitutional: He is oriented to person, place, and time. He appears well-developed and well-nourished. No distress.  HENT:  Head: Normocephalic and atraumatic.  Right Ear: Tympanic membrane and ear canal normal.  Left Ear: Tympanic membrane and ear canal normal.  Mouth/Throat: Oropharynx is clear and moist.  Oropharynx is benign.   Eyes: Conjunctivae and EOM are normal.  Neck: Normal range of motion. No tracheal deviation present.  Cardiovascular: Normal rate, regular rhythm and normal heart sounds.   Pulmonary/Chest: Effort normal and breath sounds normal. No respiratory distress. He has no wheezes. He has no rales.  Musculoskeletal: Normal range of motion.  Neurological: He is alert and oriented to person, place, and time.  Skin: Skin is warm and dry.  Psychiatric: He has a normal mood and affect. His behavior is normal.    ED Course  Procedures (including critical care time)  DIAGNOSTIC STUDIES: Oxygen Saturation is 99% on RA, normal by my interpretation.    COORDINATION OF CARE: 12:18 PM-Discussed treatment plan which includes nasal spray and OTC medications with pt at bedside and pt agreed to plan.   Labs Review Labs Reviewed - No data to display  Imaging Review No results found.   EKG Interpretation None      MDM   Final diagnoses:  None    1. URI  Uncomplicated URI  requiring supportive measures only.  I personally performed the services described in this documentation, which was scribed in my presence. The recorded information has been reviewed and is accurate.  Arnoldo HookerShari A Indonesia Mckeough, PA-C 07/22/13 1226

## 2013-07-22 NOTE — Discharge Instructions (Signed)
Antibiotic Nonuse  Your caregiver felt that the infection or problem was not one that would be helped with an antibiotic. Infections may be caused by viruses or bacteria. Only a caregiver can tell which one of these is the likely cause of an illness. A cold is the most common cause of infection in both adults and children. A cold is a virus. Antibiotic treatment will have no effect on a viral infection. Viruses can lead to many lost days of work caring for sick children and many missed days of school. Children may catch as many as 10 "colds" or "flus" per year during which they can be tearful, cranky, and uncomfortable. The goal of treating a virus is aimed at keeping the ill person comfortable. Antibiotics are medications used to help the body fight bacterial infections. There are relatively few types of bacteria that cause infections but there are hundreds of viruses. While both viruses and bacteria cause infection they are very different types of germs. A viral infection will typically go away by itself within 7 to 10 days. Bacterial infections may spread or get worse without antibiotic treatment. Examples of bacterial infections are:  Sore throats (like strep throat or tonsillitis).  Infection in the lung (pneumonia).  Ear and skin infections. Examples of viral infections are:  Colds or flus.  Most coughs and bronchitis.  Sore throats not caused by Strep.  Runny noses. It is often best not to take an antibiotic when a viral infection is the cause of the problem. Antibiotics can kill off the helpful bacteria that we have inside our body and allow harmful bacteria to start growing. Antibiotics can cause side effects such as allergies, nausea, and diarrhea without helping to improve the symptoms of the viral infection. Additionally, repeated uses of antibiotics can cause bacteria inside of our body to become resistant. That resistance can be passed onto harmful bacterial. The next time you have  an infection it may be harder to treat if antibiotics are used when they are not needed. Not treating with antibiotics allows our own immune system to develop and take care of infections more efficiently. Also, antibiotics will work better for [place] when they are prescribed for bacterial infections. Treatments for a child that is ill may include:  Give extra fluids throughout the day to stay hydrated.  Get plenty of rest.  Only give your child over-the-counter or prescription medicines for pain, discomfort, or fever as directed by your caregiver.  The use of a cool mist humidifier may help stuffy noses.  Cold medications if suggested by your caregiver. Your caregiver may decide to start you on an antibiotic if:  The problem you were seen for today continues for a longer length of time than expected.  You develop a secondary bacterial infection. SEEK MEDICAL CARE IF:  Fever lasts longer than 5 days.  Symptoms continue to get worse after 5 to 7 days or become severe.  Difficulty in breathing develops.  Signs of dehydration develop (poor drinking, rare urinating, dark colored urine).  Changes in behavior or worsening tiredness (listlessness or lethargy). Document Released: 04/10/2001 Document Revised: 04/24/2011 Document Reviewed: 10/07/2008 [facility] Patient Information 2014 [place], [place].   RECOMMEND PLAIN SALINE NASAL SPRAYS, TYLENOL COLD AND SINUS AND PLENTY OF FLUIDS.

## 2013-07-22 NOTE — ED Notes (Signed)
Pt in c/o productive cough since yesterday, with generalized fatigue and sore throat, took some cough syrup this am it helped some, but patient did not feel well enough to go to work.

## 2013-07-23 NOTE — ED Provider Notes (Signed)
Medical screening examination/treatment/procedure(s) were performed by non-physician practitioner and as supervising physician I was immediately available for consultation/collaboration.    Suzi Roots, MD 07/23/13 [phone]

## 2013-12-13 ENCOUNTER — Encounter (HOSPITAL_COMMUNITY): Payer: Self-pay | Admitting: Emergency Medicine

## 2013-12-13 ENCOUNTER — Emergency Department (HOSPITAL_COMMUNITY)
Admission: EM | Admit: 2013-12-13 | Discharge: 2013-12-13 | Disposition: A | Discharge status: Home / Self Care | Payer: Self-pay | Attending: Emergency Medicine | Admitting: Emergency Medicine

## 2013-12-13 DIAGNOSIS — R42 Dizziness and giddiness: Secondary | ICD-10-CM | POA: Insufficient documentation

## 2013-12-13 DIAGNOSIS — Z87891 Personal history of nicotine dependence: Secondary | ICD-10-CM | POA: Insufficient documentation

## 2013-12-13 DIAGNOSIS — R531 Weakness: Secondary | ICD-10-CM | POA: Insufficient documentation

## 2013-12-13 DIAGNOSIS — N341 Nonspecific urethritis: Secondary | ICD-10-CM | POA: Insufficient documentation

## 2013-12-13 DIAGNOSIS — R319 Hematuria, unspecified: Secondary | ICD-10-CM

## 2013-12-13 DIAGNOSIS — Z91040 Latex allergy status: Secondary | ICD-10-CM | POA: Insufficient documentation

## 2013-12-13 DIAGNOSIS — N342 Other urethritis: Secondary | ICD-10-CM

## 2013-12-13 LAB — CBC
HCT: 42.7 % (ref 39.0–52.0)
Hemoglobin: 14.8 g/dL (ref 13.0–17.0)
MCH: 29.1 pg (ref 26.0–34.0)
MCHC: 34.7 g/dL (ref 30.0–36.0)
MCV: 84.1 fL (ref 78.0–100.0)
Platelets: 259 10*3/uL (ref 150–400)
RBC: 5.08 MIL/uL (ref 4.22–5.81)
RDW: 12.1 % (ref 11.5–15.5)
WBC: 13.7 10*3/uL — ABNORMAL HIGH (ref 4.0–10.5)

## 2013-12-13 LAB — URINALYSIS, ROUTINE W REFLEX MICROSCOPIC
Bilirubin Urine: NEGATIVE
Glucose, UA: NEGATIVE mg/dL
Ketones, ur: NEGATIVE mg/dL
Leukocytes, UA: NEGATIVE
Nitrite: NEGATIVE
PROTEIN: NEGATIVE mg/dL
SPECIFIC GRAVITY, URINE: 1.027 (ref 1.005–1.030)
UROBILINOGEN UA: 1 mg/dL (ref 0.0–1.0)
pH: 7 (ref 5.0–8.0)

## 2013-12-13 LAB — BASIC METABOLIC PANEL
Anion gap: 13 (ref 5–15)
BUN: 15 mg/dL (ref 6–23)
CALCIUM: 9.6 mg/dL (ref 8.4–10.5)
CO2: 25 mEq/L (ref 19–32)
Chloride: 100 mEq/L (ref 96–112)
Creatinine, Ser: 0.94 mg/dL (ref 0.50–1.35)
GFR calc non Af Amer: >90 mL/min (ref >90)
Glucose, Bld: 82 mg/dL (ref 70–99)
POTASSIUM: 4.1 meq/L (ref 3.7–5.3)
SODIUM: 138 meq/L (ref 137–147)

## 2013-12-13 LAB — URINE MICROSCOPIC-ADD ON

## 2013-12-13 MED ORDER — DOXYCYCLINE HYCLATE 100 MG PO CAPS: 100.0000 mg | ORAL_CAPSULE | Freq: Two times a day (BID) | ORAL | Status: DC

## 2013-12-13 MED ORDER — IBUPROFEN 800 MG PO TABS
800.0000 mg | ORAL_TABLET | Freq: Once | ORAL | Status: AC
  Administered 2013-12-13: 800 mg via ORAL
  Filled 2013-12-13: qty 1

## 2013-12-13 MED ORDER — LIDOCAINE HCL (PF) 1 % IJ SOLN
2.0000 mL | Freq: Once | INTRAMUSCULAR | Status: AC
  Administered 2013-12-13: 2 mL

## 2013-12-13 MED ORDER — DOXYCYCLINE HYCLATE 100 MG PO TABS
100.0000 mg | ORAL_TABLET | Freq: Once | ORAL | Status: AC
  Administered 2013-12-13: 100 mg via ORAL
  Filled 2013-12-13: qty 1

## 2013-12-13 MED ORDER — CEFTRIAXONE SODIUM 250 MG IJ SOLR
250.0000 mg | Freq: Once | INTRAMUSCULAR | Status: AC
Start: 2013-12-13 — End: 2013-12-13
  Administered 2013-12-13: 250 mg via INTRAMUSCULAR
  Filled 2013-12-13: qty 250

## 2013-12-13 NOTE — ED Notes (Signed)
Patient got up to go use the restroom tonight and when he tried to urinate, patient had a sharp pain and blood came out. Denies flank/abd pain. Denies any previous urinary frequency or burning. Blood present on pants. BP 134/67, HR 98, RR 16, 96% on RA

## 2013-12-13 NOTE — ED Provider Notes (Signed)
CSN: [id number]     Arrival date & time 12/13/13  0407 History   First MD Initiated Contact with Patient 12/13/13 0500     Chief Complaint  Patient presents with  . Hematuria     (Consider location/radiation/quality/duration/timing/severity/associated sxs/prior Treatment) HPI 23 year old male presents with hematuria and penile pain that started this evening. He states that he went to the bathroom and felt a sharp pain in his penis while urinating. Denies any abdominal pain or back pain. No flank pain. He noticed "a lot of blood". Patient states that even after urinating he still has some pain in his penis. Denies any penile swelling, discharge, or testicular pain or swelling. Has been with the same male partner for over 10 years. Does not use sexual protection. Denies history of STD or concerns for STD. He states that he felt lightheaded and dizzy after noticing the blood. Since then the dizziness is resolved but he still feels overall weak.  History reviewed. No pertinent past medical history. Past Surgical History  Procedure Laterality Date  . Abdominal surgery    . Hernia repair     No family history on file. History  Substance Use Topics  . Smoking status: Former Smoker -- 0.50 packs/day  . Smokeless tobacco: Not on file  . Alcohol Use: Yes     Comment: occasion    Review of Systems  Gastrointestinal: Negative for nausea, vomiting and abdominal pain.  Genitourinary: Positive for dysuria, hematuria and penile pain. Negative for penile swelling and testicular pain.  Neurological: Positive for weakness and light-headedness.  All other systems reviewed and are negative.     Allergies  Latex  Home Medications   Prior to Admission medications   Not on File   BP 126/57  Pulse 95  Temp(Src) 98 F (36.7 C) (Oral)  Resp 19  SpO2 100% Physical Exam  Nursing note and vitals reviewed. Constitutional: He is oriented to person, place, and time. He appears well-developed  and well-nourished. No distress.  HENT:  Head: Normocephalic and atraumatic.  Right Ear: External ear normal.  Left Ear: External ear normal.  Nose: Nose normal.  Eyes: Right eye exhibits no discharge. Left eye exhibits no discharge.  Neck: Neck supple.  Cardiovascular: Normal rate, regular rhythm, normal heart sounds and intact distal pulses.   Pulmonary/Chest: Effort normal and breath sounds normal.  Abdominal: Soft. He exhibits no distension. There is no tenderness.  Genitourinary: Testes normal. Right testis shows no mass, no swelling and no tenderness. Left testis shows no mass, no swelling and no tenderness. Circumcised. Penile tenderness present. No penile erythema. No discharge found.  Small amount of blood at meatus. Mild tenderness when palpating penis. No swelling. No focal area of tenderness.  Musculoskeletal: He exhibits no edema.  Neurological: He is alert and oriented to person, place, and time.  Skin: Skin is warm and dry. He is not diaphoretic.    ED Course  Procedures (including critical care time) Labs Review Labs Reviewed  URINALYSIS, ROUTINE W REFLEX MICROSCOPIC - Abnormal; Notable for the following:    Hgb urine dipstick SMALL (*)    All other components within normal limits  CBC - Abnormal; Notable for the following:    WBC 13.7 (*)    All other components within normal limits  GC/CHLAMYDIA PROBE AMP  BASIC METABOLIC PANEL  URINE MICROSCOPIC-ADD ON    Imaging Review No results found.   EKG Interpretation None      MDM   Final diagnoses:  Hematuria  Urethritis    Patient's hematuria is likely from urethra right us. He has no back, abdominal, or flank pain to suggest renal stone. Patient is currently comfortable. Urine is negative besides small amount of blood. At this point will treat with Rocephin and doxycycline. Discussed importance of following up with a PCP.    Audree CamelScott T Evelia Waskey, MD 12/13/13 0700

## 2013-12-13 NOTE — Discharge Instructions (Signed)
Hematuria Hematuria is blood in your urine. It can be caused by a bladder infection, kidney infection, prostate infection, kidney stone, or cancer of your urinary tract. Infections can usually be treated with medicine, and a kidney stone usually will pass through your urine. If neither of these is the cause of your hematuria, further workup to find out the reason may be needed. It is very important that you tell your health care provider about any blood you see in your urine, even if the blood stops without treatment or happens without causing pain. Blood in your urine that happens and then stops and then happens again can be a symptom of a very serious condition. Also, pain is not a symptom in the initial stages of many urinary cancers. HOME CARE INSTRUCTIONS   Drink lots of fluid, 3-4 quarts a day. If you have been diagnosed with an infection, cranberry juice is especially recommended, in addition to large amounts of water.  Avoid caffeine, tea, and carbonated beverages because they tend to irritate the bladder.  Avoid alcohol because it may irritate the prostate.  Take all medicines as directed by your health care provider.  If you were prescribed an antibiotic medicine, finish it all even if you start to feel better.  If you have been diagnosed with a kidney stone, follow your health care provider's instructions regarding straining your urine to catch the stone.  Empty your bladder often. Avoid holding urine for long periods of time.  After a bowel movement, women should cleanse front to back. Use each tissue only once.  Empty your bladder before and after sexual intercourse if you are a male. SEEK MEDICAL CARE IF:  You develop back pain.  You have a fever.  You have a feeling of sickness in your stomach (nausea) or vomiting.  Your symptoms are not better in 3 days. Return sooner if you are getting worse. SEEK IMMEDIATE MEDICAL CARE IF:   You develop severe vomiting and are  unable to keep the medicine down.  You develop severe back or abdominal pain despite taking your medicines.  You begin passing a large amount of blood or clots in your urine.  You feel extremely weak or faint, or you pass out. MAKE SURE YOU:   Understand these instructions.  Will watch your condition.  Will get help right away if you are not doing well or get worse. Document Released: 01/30/2005 Document Revised: 06/16/2013 Document Reviewed: 09/30/2012 [facility] Patient Information 2015 [place], [place]. This information is not intended to replace advice given to you by your health care provider. Make sure you discuss any questions you have with your health care provider.       Urethritis Urethritis is an inflammation of the tube through which urine exits your bladder (urethra).  CAUSES Urethritis is often caused by an infection in your urethra. The infection can be viral, like herpes. The infection can also be bacterial, like gonorrhea. RISK FACTORS Risk factors of urethritis include:  Having sex without using a condom.  Having multiple sexual partners.  Having poor hygiene. SIGNS AND SYMPTOMS Symptoms of urethritis are less noticeable in women than in men. These symptoms include:  Burning feeling when you urinate (dysuria).  Discharge from your urethra.  Blood in your urine (hematuria).  Urinating more than usual. DIAGNOSIS  To confirm a diagnosis of urethritis, your health care provider will do the following:  Ask about your sexual history.  Perform a physical exam.  Have you provide a sample of  your urine for lab testing.  Use a cotton swab to gently collect a sample from your urethra for lab testing. TREATMENT  It is important to treat urethritis. Depending on the cause, untreated urethritis may lead to serious genital infections and possibly infertility. Urethritis caused by a bacterial infection is treated with antibiotic medicine. All sexual partners must  be treated.  HOME CARE INSTRUCTIONS  Do not have sex until the test results are known and treatment is completed, even if your symptoms go away before you finish treatment.  If you were prescribed an antibiotic, finish it all even if you start to feel better. SEEK MEDICAL CARE IF:   Your symptoms are not improved in 3 days.  Your symptoms are getting worse.  You develop abdominal pain or pelvic pain (in women).  You develop joint pain.  You have a fever. SEEK IMMEDIATE MEDICAL CARE IF:   You have severe pain in the belly, back, or side.  You have repeated vomiting. MAKE SURE YOU:  Understand these instructions.  Will watch your condition.  Will get help right away if you are not doing well or get worse. Document Released: 07/26/2000 Document Revised: 06/16/2013 Document Reviewed: 09/30/2012 [facility] Patient Information 2015 [place], [place]. This information is not intended to replace advice given to you by your health care provider. Make sure you discuss any questions you have with your health care provider.

## 2013-12-16 LAB — GC/CHLAMYDIA PROBE AMP
CT PROBE, AMP APTIMA: NEGATIVE
GC PROBE AMP APTIMA: NEGATIVE

## 2015-10-30 ENCOUNTER — Encounter (HOSPITAL_COMMUNITY): Payer: Self-pay | Admitting: Emergency Medicine

## 2015-10-30 ENCOUNTER — Emergency Department (HOSPITAL_COMMUNITY)
Admission: EM | Admit: 2015-10-30 | Discharge: 2015-10-30 | Disposition: A | Discharge status: Home / Self Care | Payer: Self-pay | Attending: Emergency Medicine | Admitting: Emergency Medicine

## 2015-10-30 DIAGNOSIS — Z792 Long term (current) use of antibiotics: Secondary | ICD-10-CM | POA: Insufficient documentation

## 2015-10-30 DIAGNOSIS — N342 Other urethritis: Secondary | ICD-10-CM | POA: Insufficient documentation

## 2015-10-30 DIAGNOSIS — Z87891 Personal history of nicotine dependence: Secondary | ICD-10-CM | POA: Insufficient documentation

## 2015-10-30 LAB — URINALYSIS, ROUTINE W REFLEX MICROSCOPIC
Bilirubin Urine: NEGATIVE
GLUCOSE, UA: NEGATIVE mg/dL
HGB URINE DIPSTICK: NEGATIVE
KETONES UR: NEGATIVE mg/dL
Nitrite: NEGATIVE
Protein, ur: NEGATIVE mg/dL
Specific Gravity, Urine: 1.028 (ref 1.005–1.030)
pH: 6 (ref 5.0–8.0)

## 2015-10-30 LAB — RPR: RPR Ser Ql: NONREACTIVE

## 2015-10-30 LAB — URINE MICROSCOPIC-ADD ON

## 2015-10-30 LAB — HIV ANTIBODY (ROUTINE TESTING W REFLEX): HIV Screen 4th Generation wRfx: NONREACTIVE

## 2015-10-30 MED ORDER — CEFTRIAXONE SODIUM 250 MG IJ SOLR
250.0000 mg | Freq: Once | INTRAMUSCULAR | Status: AC
  Administered 2015-10-30: 250 mg via INTRAMUSCULAR
  Filled 2015-10-30: qty 250

## 2015-10-30 MED ORDER — AZITHROMYCIN 1 G PO PACK
1.0000 g | PACK | Freq: Once | ORAL | Status: AC
  Administered 2015-10-30: 1 g via ORAL
  Filled 2015-10-30: qty 1

## 2015-10-30 MED ORDER — LIDOCAINE HCL 1 % IJ SOLN
INTRAMUSCULAR | Status: AC
  Filled 2015-10-30: qty 20

## 2015-10-30 NOTE — ED Triage Notes (Signed)
Pt c/o white penile discharge with dysuria onset tonight.

## 2015-10-30 NOTE — ED Provider Notes (Signed)
[facility]-EMERGENCY DEPT Provider Note   CSN: [id number] Arrival date & time: 10/30/15  0321     History   Chief Complaint Chief Complaint  Patient presents with  . Penile Discharge    HPI Aaron Scott is a 25 y.o. male.  The history is provided by the patient.  Penile Discharge   He noted onset this evening of dysuria, sensitivity of the glans penis, and urethral discharge. He admits to unprotected sex.  History reviewed. No pertinent past medical history.  There are no active problems to display for this patient.   Past Surgical History:  Procedure Laterality Date  . ABDOMINAL SURGERY    . HERNIA REPAIR         Home Medications    Prior to Admission medications   Medication Sig Start Date End Date Taking? Authorizing Provider  doxycycline (VIBRAMYCIN) 100 MG capsule Take 1 capsule (100 mg total) by mouth 2 (two) times daily. One po bid x 7 days 12/13/13   Pricilla LovelessScott Goldston, MD    Family History No family history on file.  Social History Social History  Substance Use Topics  . Smoking status: Former Smoker    Packs/day: 0.50  . Smokeless tobacco: Never Used  . Alcohol use Yes     Comment: occasion     Allergies   Latex   Review of Systems Review of Systems  Genitourinary: Positive for discharge.  All other systems reviewed and are negative.    Physical Exam Updated Vital Signs BP 129/85 (BP Location: Left Arm)   Pulse 89   Temp 97.9 F (36.6 C) (Oral)   Resp 14   Ht 5\' 6"  (1.676 m)   Wt 230 lb (104.3 kg)   SpO2 95%   BMI 37.12 kg/m   Physical Exam  Nursing note and vitals reviewed.  25 year old male, resting comfortably and in no acute distress. Vital signs are normal. Oxygen saturation is 95%, which is normal. Head is normocephalic and atraumatic. PERRLA, EOMI. Oropharynx is clear. Neck is nontender and supple without adenopathy or JVD. Back is nontender and there is no CVA tenderness. Lungs are clear without rales, wheezes, or  rhonchi. Chest is nontender. Heart has regular rate and rhythm without murmur. Abdomen is soft, flat, nontender without masses or hepatosplenomegaly and peristalsis is normoactive. Genitalia: Circumcised penis. Testes descended without masses or tenderness. Mild urethral discharge is present. No penile lesions are seen. There is no inguinal adenopathy. Extremities have no cyanosis or edema, full range of motion is present. Skin is warm and dry without rash. Neurologic: Mental status is normal, cranial nerves are intact, there are no motor or sensory deficits.  ED Treatments / Results  Labs (all labs ordered are listed, but only abnormal results are displayed) Labs Reviewed - No data to display  Procedures Procedures (including critical care time)  Medications Ordered in ED Medications - No data to display   Initial Impression / Assessment and Plan / ED Course  I have reviewed the triage vital signs and the nursing notes.   Clinical Course   Urethritis-probably gonorrhea or chlamydia. He is given a dose of ceftriaxone and azithromycin and is advised to use appropriate safe sex practices. Advised to have all sexual contacts treated. Old records are reviewed, and he has several prior ED visits for urethritis.  Final Clinical Impressions(s) / ED Diagnoses   Final diagnoses:  Urethritis    New Prescriptions New Prescriptions   No medications on file  Dione Booze, MD 10/30/15 [phone]

## 2015-11-01 LAB — GC/CHLAMYDIA PROBE AMP (~~LOC~~) NOT AT ARMC
Chlamydia: NEGATIVE
Neisseria Gonorrhea: POSITIVE — AB

## 2015-11-02 ENCOUNTER — Telehealth (HOSPITAL_BASED_OUTPATIENT_CLINIC_OR_DEPARTMENT_OTHER): Payer: Self-pay | Admitting: Emergency Medicine

## 2015-11-22 ENCOUNTER — Ambulatory Visit (HOSPITAL_COMMUNITY)
Admission: EM | Admit: 2015-11-22 | Discharge: 2015-11-22 | Disposition: A | Discharge status: Home / Self Care | Payer: Self-pay | Attending: Internal Medicine | Admitting: Internal Medicine

## 2015-11-22 ENCOUNTER — Encounter (HOSPITAL_COMMUNITY): Payer: Self-pay | Admitting: Family Medicine

## 2015-11-22 DIAGNOSIS — J039 Acute tonsillitis, unspecified: Secondary | ICD-10-CM

## 2015-11-22 DIAGNOSIS — J029 Acute pharyngitis, unspecified: Secondary | ICD-10-CM | POA: Insufficient documentation

## 2015-11-22 LAB — POCT RAPID STREP A: Streptococcus, Group A Screen (Direct): NEGATIVE

## 2015-11-22 MED ORDER — AMOXICILLIN 500 MG PO CAPS: 500.0000 mg | ORAL_CAPSULE | Freq: Three times a day (TID) | ORAL | 0 refills | Status: DC

## 2015-11-22 NOTE — ED Triage Notes (Signed)
Pt here for sore throat. sts swollen and painful.

## 2015-11-24 LAB — CULTURE, GROUP A STREP (THRC)

## 2015-11-25 NOTE — ED Provider Notes (Signed)
CSN: [id number]     Arrival date & time 11/22/15  1353 History   First MD Initiated Contact with Patient 11/22/15 1638     Chief Complaint  Patient presents with  . Sore Throat   (Consider location/radiation/quality/duration/timing/severity/associated sxs/prior Treatment) HPI  History obtained from patient:   LOCATION:throat SEVERITY:6 DURATION:1 day CONTEXT:sudden onset QUALITY:scratchy MODIFYING FACTORS:OTC meds without relief ASSOCIATED SYMPTOMS:hurts to swallow, fever,  TIMING:now constant    History reviewed. No pertinent past medical history. Past Surgical History:  Procedure Laterality Date  . ABDOMINAL SURGERY    . HERNIA REPAIR     History reviewed. No pertinent family history. Social History  Substance Use Topics  . Smoking status: Former Smoker    Packs/day: 0.50  . Smokeless tobacco: Never Used  . Alcohol use Yes     Comment: occasion    Review of Systems  Denies: HEADACHE, NAUSEA, ABDOMINAL PAIN, CHEST PAIN, CONGESTION, DYSURIA, SHORTNESS OF BREATH  Allergies  Latex  Home Medications   Prior to Admission medications   Medication Sig Start Date End Date Taking? Authorizing Provider  amoxicillin (AMOXIL) 500 MG capsule Take 1 capsule (500 mg total) by mouth 3 (three) times daily. 11/22/15   Tharon AquasFrank C Ganon Demasi, PA   Meds Ordered and Administered this Visit  Medications - No data to display  BP 124/77   Pulse 83   Temp 98.6 F (37 C)   Resp 18   SpO2 100%  No data found.   Physical Exam NURSES NOTES AND VITAL SIGNS REVIEWED. CONSTITUTIONAL: Well developed, well nourished, no acute distress HEENT: normocephalic, atraumatic, Throat tonsillar hypertrophy with exudate, right side more inflamed than left, no signs of peritonsillar abscess.  EYES: Conjunctiva normal NECK:normal ROM, supple, no adenopathy PULMONARY:No respiratory distress, normal effort ABDOMINAL: Soft, ND, NT BS+, No CVAT MUSCULOSKELETAL: Normal ROM of all extremities,  SKIN: warm  and dry without rash PSYCHIATRIC: Mood and affect, behavior are normal  Urgent Care Course   Clinical Course    Procedures (including critical care time)  Labs Review Labs Reviewed  CULTURE, GROUP A STREP [facility])  POCT RAPID STREP A    Imaging Review No results found.   Visual Acuity Review  Right Eye Distance:   Left Eye Distance:   Bilateral Distance:    Right Eye Near:   Left Eye Near:    Bilateral Near:         MDM   1. Sore throat   2. Acute pharyngitis, unspecified etiology   3. Tonsillitis    Patient is reassured that there are no issues that require transfer to higher level of care at this time or additional tests. Patient is advised to continue home symptomatic treatment. Patient is advised that if there are new or worsening symptoms to attend the emergency department, contact primary care provider, or return to UC. Instructions of care provided discharged home in stable condition.    THIS NOTE WAS GENERATED USING A VOICE RECOGNITION SOFTWARE PROGRAM. ALL REASONABLE EFFORTS  WERE MADE TO PROOFREAD THIS DOCUMENT FOR ACCURACY.  I have verbally reviewed the discharge instructions with the patient. A printed AVS was given to the patient.  All questions were answered prior to discharge.        Tharon AquasFrank C Joselyne Spake, PA 11/25/15 1003

## 2015-12-07 ENCOUNTER — Telehealth (HOSPITAL_BASED_OUTPATIENT_CLINIC_OR_DEPARTMENT_OTHER): Payer: Self-pay | Admitting: Emergency Medicine

## 2015-12-07 NOTE — Telephone Encounter (Signed)
Lost to followup 

## 2016-07-17 ENCOUNTER — Encounter (HOSPITAL_COMMUNITY): Payer: Self-pay | Admitting: Emergency Medicine

## 2016-07-17 ENCOUNTER — Ambulatory Visit (HOSPITAL_COMMUNITY)
Admission: EM | Admit: 2016-07-17 | Discharge: 2016-07-17 | Disposition: A | Discharge status: Home / Self Care | Payer: Self-pay | Attending: Emergency Medicine | Admitting: Emergency Medicine

## 2016-07-17 DIAGNOSIS — Z87891 Personal history of nicotine dependence: Secondary | ICD-10-CM | POA: Insufficient documentation

## 2016-07-17 DIAGNOSIS — J029 Acute pharyngitis, unspecified: Secondary | ICD-10-CM | POA: Insufficient documentation

## 2016-07-17 LAB — POCT RAPID STREP A: STREPTOCOCCUS, GROUP A SCREEN (DIRECT): NEGATIVE

## 2016-07-17 NOTE — Discharge Instructions (Signed)
The appearance of a sore throat looks more like it is due to a virus. The throat culture will be ready in 2-3 days. If it grows out strep we will call you and treat to with the prescription over the telephone. Cepacol lozenges for sore throat pain. Ibuprofen 6 mg every 6 hours for sore throat pain and discomfort. Drink plenty of cool liquids and stay well-hydrated.

## 2016-07-17 NOTE — ED Provider Notes (Signed)
CSN: [id number]     Arrival date & time 07/17/16  1328 History   None    Chief Complaint  Patient presents with  . Sore Throat   (Consider location/radiation/quality/duration/timing/severity/associated sxs/prior Treatment) 26 year old male developed sore throat this morning. Is uncertain as to whether he may remain not have fever. Denies other symptoms. He states he sure he has a strep throat.      History reviewed. No pertinent past medical history. Past Surgical History:  Procedure Laterality Date  . ABDOMINAL SURGERY    . HERNIA REPAIR     No family history on file. Social History  Substance Use Topics  . Smoking status: Former Smoker    Packs/day: 0.50  . Smokeless tobacco: Never Used  . Alcohol use Yes     Comment: occasion    Review of Systems  Constitutional: Negative.   HENT: Positive for sore throat.   Respiratory: Negative.   Neurological: Negative.   All other systems reviewed and are negative.   Allergies  Latex  Home Medications   Prior to Admission medications   Not on File   Meds Ordered and Administered this Visit  Medications - No data to display  BP 126/60 (BP Location: Right Arm)   Pulse 89   Temp 99 F (37.2 C) (Oral)   Resp 16   SpO2 100%  No data found.   Physical Exam  Constitutional: He is oriented to person, place, and time. He appears well-developed and well-nourished. No distress.  HENT:  Right Ear: External ear normal.  Left Ear: External ear normal.  Mouth/Throat: No oropharyngeal exudate.  Oropharynx with erythema. No exudates. Bilateral TMs are normal  Eyes: EOM are normal.  Neck: Normal range of motion. Neck supple.  Cardiovascular: Normal rate, regular rhythm and normal heart sounds.   Pulmonary/Chest: Effort normal and breath sounds normal.  Lymphadenopathy:    He has no cervical adenopathy.  Neurological: He is alert and oriented to person, place, and time.  Skin: Skin is warm and dry.  Psychiatric: He has a  normal mood and affect.  Nursing note and vitals reviewed.   Urgent Care Course     Procedures (including critical care time)  Labs Review Labs Reviewed  POCT RAPID STREP A    Imaging Review No results found.   Visual Acuity Review  Right Eye Distance:   Left Eye Distance:   Bilateral Distance:    Right Eye Near:   Left Eye Near:    Bilateral Near:         MDM   1. Sore throat    The appearance of a sore throat looks more like it is due to a virus. The throat culture will be ready in 2-3 days. If it grows out strep we will call you and treat to with the prescription over the telephone. Cepacol lozenges for sore throat pain. Ibuprofen 6 mg every 6 hours for sore throat pain and discomfort. Drink plenty of cool liquids and stay well-hydrated.     Hayden RasmussenMabe, Chevie Birkhead, NP 07/17/16 1557

## 2016-07-17 NOTE — ED Triage Notes (Signed)
Complains of waking this morning with a sore throat, weakness, denies sneezing or coughing

## 2016-07-18 LAB — CULTURE, GROUP A STREP (THRC)

## 2016-11-13 ENCOUNTER — Encounter (HOSPITAL_COMMUNITY): Payer: Self-pay | Admitting: Emergency Medicine

## 2016-11-13 ENCOUNTER — Ambulatory Visit (HOSPITAL_COMMUNITY)
Admission: EM | Admit: 2016-11-13 | Discharge: 2016-11-13 | Disposition: A | Discharge status: Home / Self Care | Payer: Self-pay | Attending: Internal Medicine | Admitting: Internal Medicine

## 2016-11-13 DIAGNOSIS — Z87891 Personal history of nicotine dependence: Secondary | ICD-10-CM | POA: Insufficient documentation

## 2016-11-13 DIAGNOSIS — H6691 Otitis media, unspecified, right ear: Secondary | ICD-10-CM

## 2016-11-13 LAB — POCT RAPID STREP A: Streptococcus, Group A Screen (Direct): NEGATIVE

## 2016-11-13 MED ORDER — IBUPROFEN 800 MG PO TABS: 800.0000 mg | ORAL_TABLET | Freq: Once | ORAL | Status: DC

## 2016-11-13 MED ORDER — NAPROXEN 500 MG PO TABS: 500.0000 mg | ORAL_TABLET | Freq: Two times a day (BID) | ORAL | 0 refills | Status: DC

## 2016-11-13 MED ORDER — AMOXICILLIN-POT CLAVULANATE 875-125 MG PO TABS: 1.0000 | ORAL_TABLET | Freq: Two times a day (BID) | ORAL | 0 refills | Status: AC

## 2016-11-13 NOTE — ED Notes (Signed)
Ice water given to pt per MD request

## 2016-11-13 NOTE — ED Provider Notes (Signed)
[facility]    CSN: [id number] Arrival date & time: 11/13/16  1350     History   Chief Complaint Chief Complaint  Patient presents with  . Sore Throat    HPI Garison Genova is a 26 y.o. male. He presents today with bad sore throat for the last 2 days. Headache, tactile temperature.      HPI  History reviewed. No pertinent past medical history.   Past Surgical History:  Procedure Laterality Date  . ABDOMINAL SURGERY    . HERNIA REPAIR         Home Medications    Prior to Admission medications   Medication Sig Start Date End Date Taking? Authorizing Provider  amoxicillin-clavulanate (AUGMENTIN) 875-125 MG tablet Take 1 tablet by mouth 2 (two) times daily. 11/13/16 11/23/16  Aaron Moore, MD  naproxen (NAPROSYN) 500 MG tablet Take 1 tablet (500 mg total) by mouth 2 (two) times daily. 11/13/16   Aaron Moore, MD    Family History History reviewed. No pertinent family history.  Social History Social History  Substance Use Topics  . Smoking status: Former Smoker    Packs/day: 0.50  . Smokeless tobacco: Never Used  . Alcohol use Yes     Comment: occasion     Allergies   Latex   Review of Systems Review of Systems  All other systems reviewed and are negative.    Physical Exam Triage Vital Signs ED Triage Vitals [11/13/16 1540]  Enc Vitals Group     BP (!) 135/93     Pulse Rate 79     Resp      Temp 99 F (37.2 C)     Temp Source Oral     SpO2 99 %     Weight      Height      Pain Score 10     Pain Loc    Updated Vital Signs BP (!) 135/93 (BP Location: Left Arm)   Pulse 79   Temp 99 F (37.2 C) (Oral)   SpO2 99%   Physical Exam  Constitutional: He is oriented to person, place, and time. No distress.  Alert, nicely groomed  HENT:  Head: Atraumatic.  Right TM is dull, red, bulging Left TM is dull Marked nasal congestion bilaterally Throat is slightly injected, with postnasal drainage evident Voice sounds quite  congested  Eyes:  Conjugate gaze, no eye redness/drainage  Neck: Neck supple.  Cardiovascular: Normal rate and regular rhythm.   Pulmonary/Chest: No respiratory distress. He has no wheezes. He has no rales.  Lungs clear, symmetric breath sounds  Abdominal: He exhibits no distension.  Musculoskeletal: Normal range of motion.  Neurological: He is alert and oriented to person, place, and time.  Skin: Skin is warm and dry.  No cyanosis  Nursing note and vitals reviewed.    UC Treatments / Results  Labs Results for orders placed or performed during the hospital encounter of 11/13/16  Culture, group A strep  Result Value Ref Range   Specimen Description THROAT    Special Requests NONE    Culture CULTURE REINCUBATED FOR BETTER GROWTH    Report Status PENDING   POCT rapid strep A [facility])  Result Value Ref Range   Streptococcus, Group A Screen (Direct) NEGATIVE NEGATIVE    Procedures Procedures (including critical care time) None today  Final Clinical Impressions(s) / UC Diagnoses   Final diagnoses:  Acute right otitis media   Anticipate gradual improvement in well  being, sore throat, headache, over the next several days.  Strep swab was negative at the urgent care today, throat culture is pending.  Prescriptions for amoxicillin/clavulanate (antibiotic) and naproxen (anti inflammatory/pain reliever) were given.  Push fluids and rest.  Note for work for today/tomorrow.  Recheck for new fever >100.5, increasing phlegm production/nasal discharge, or if not starting to improve in a few days.     New Prescriptions New Prescriptions   AMOXICILLIN-CLAVULANATE (AUGMENTIN) 875-125 MG TABLET    Take 1 tablet by mouth 2 (two) times daily.   NAPROXEN (NAPROSYN) 500 MG TABLET    Take 1 tablet (500 mg total) by mouth 2 (two) times daily.     Controlled Substance Prescriptions [place] Controlled Substance Registry consulted? No   Aaron Moore, MD 11/14/16 1341

## 2016-11-13 NOTE — Discharge Instructions (Addendum)
Anticipate gradual improvement in well being, sore throat, headache, over the next several days.  Strep swab was negative at the urgent care today, throat culture is pending.  Prescriptions for amoxicillin/clavulanate (antibiotic) and naproxen (anti inflammatory/pain reliever) were given.  Push fluids and rest.  Note for work for today/tomorrow.  Recheck for new fever >100.5, increasing phlegm production/nasal discharge, or if not starting to improve in a few days.

## 2016-11-13 NOTE — ED Triage Notes (Signed)
Pt has had a sore throat for 2 days.  Pt has been taking someone elses Flagyl.  He has had 4 dises.

## 2016-11-16 LAB — CULTURE, GROUP A STREP (THRC)

## 2017-08-27 ENCOUNTER — Emergency Department (HOSPITAL_COMMUNITY)
Admission: EM | Admit: 2017-08-27 | Discharge: 2017-08-28 | Disposition: A | Discharge status: Home / Self Care | Payer: Self-pay | Attending: Emergency Medicine | Admitting: Emergency Medicine

## 2017-08-27 ENCOUNTER — Other Ambulatory Visit: Payer: Self-pay

## 2017-08-27 DIAGNOSIS — Z87891 Personal history of nicotine dependence: Secondary | ICD-10-CM | POA: Insufficient documentation

## 2017-08-27 DIAGNOSIS — K0889 Other specified disorders of teeth and supporting structures: Secondary | ICD-10-CM | POA: Insufficient documentation

## 2017-08-27 DIAGNOSIS — Z91040 Latex allergy status: Secondary | ICD-10-CM | POA: Insufficient documentation

## 2017-08-28 ENCOUNTER — Other Ambulatory Visit (HOSPITAL_COMMUNITY): Payer: Self-pay | Admitting: Physician Assistant

## 2017-08-28 MED ORDER — AMOXICILLIN-POT CLAVULANATE 875-125 MG PO TABS: 1.0000 | ORAL_TABLET | Freq: Two times a day (BID) | ORAL | 0 refills | Status: DC

## 2017-08-28 MED ORDER — NAPROXEN 500 MG PO TABS: 500.0000 mg | ORAL_TABLET | Freq: Two times a day (BID) | ORAL | 0 refills | Status: DC

## 2017-08-28 MED ORDER — LIDOCAINE VISCOUS HCL 2 % MT SOLN: 15.0000 mL | OROMUCOSAL | 0 refills | Status: DC | PRN

## 2017-08-28 NOTE — ED Notes (Cosign Needed)
Patient presented to ED after losing Augmentin and Naproxen Rx. Seen here 08/27/17 for dental pain. No distress. No worsening of symptoms. Written Rx dispensed to patient.    Elisha PonderMurray, Nikkie Liming B, PA-C 08/28/17 1636

## 2017-08-28 NOTE — Discharge Instructions (Addendum)
Take antibiotics as prescribed.  Take the entire course, even if your symptoms improve. Take naproxen 2 times a day with meals.  Do not take other anti-inflammatories at the same time open (Advil, Motrin, ibuprofen, Aleve). You may supplement with Tylenol if you need further pain control. Use viscous lidocaine as needed for further pain. Follow-up with a dentist.  There is information about the area attached in the paperwork. Return to the emergency room with any new or concerning symptoms.

## 2017-08-28 NOTE — ED Triage Notes (Signed)
Pt c/o of right lower dental pain that has been going on for at least a week that got worse tonight. Pt states he did try some numbing medication that helps but does not last long. Unsure if he has had a temp but "was sweating like a mug" 1 hour pta. Pt states he took extra strength excedrin.

## 2017-08-28 NOTE — ED Notes (Signed)
See provider assessment 

## 2017-08-28 NOTE — ED Provider Notes (Signed)
[facility] Provider Note   CSN: [id number] Arrival date & time: 08/27/17  2350     History   Chief Complaint Chief Complaint  Patient presents with  . Dental Pain    HPI Aaron Scott is a 27 y.o. male presenting for evaluation of right lower tooth pain.  Patient states for the past 6 months, he has been having intermittent mild right lower tooth pain.  Symptoms worsened today when he is eating a sandwich.  Patient states he took an Excedrin prior to arrival with mild improvement.  He has used Orajel in the past with mild improvement.  He denies pain elsewhere.  He denies fevers, chills, difficulty swallowing, or difficulty breathing.  He has no other medical problems, takes no medications daily.  Pain is worse with palpation and eating.   HPI  No past medical history on file.  There are no active problems to display for this patient.   Past Surgical History:  Procedure Laterality Date  . ABDOMINAL SURGERY    . HERNIA REPAIR          Home Medications    Prior to Admission medications   Medication Sig Start Date End Date Taking? Authorizing Provider  amoxicillin-clavulanate (AUGMENTIN) 875-125 MG tablet Take 1 tablet by mouth every 12 (twelve) hours. 08/28/17   Charmon Thorson, PA-C  lidocaine (XYLOCAINE) 2 % solution Use as directed 15 mLs in the mouth or throat as needed for mouth pain. 08/28/17   Deborh Pense, PA-C  naproxen (NAPROSYN) 500 MG tablet Take 1 tablet (500 mg total) by mouth 2 (two) times daily with a meal. 08/28/17   Samuel Mcpeek, PA-C    Family History No family history on file.  Social History Social History   Tobacco Use  . Smoking status: Former Smoker    Packs/day: 0.50  . Smokeless tobacco: Never Used  Substance Use Topics  . Alcohol use: Yes    Comment: occasion  . Drug use: Yes    Types: Marijuana     Allergies   Latex   Review of Systems Review of Systems    Constitutional: Negative for fever.  HENT: Positive for dental problem.      Physical Exam Updated Vital Signs BP 130/89   Pulse 78   Temp 97.8 F (36.6 C) (Oral)   Ht 5\' 6"  (1.676 m)   Wt 108.9 kg (240 lb)   SpO2 96%   BMI 38.74 kg/m   Physical Exam  Constitutional: He is oriented to person, place, and time. He appears well-developed and well-nourished. No distress.  HENT:  Head: Normocephalic and atraumatic.  Mouth/Throat: Uvula is midline, oropharynx is clear and moist and mucous membranes are normal.    Patient with cracked and tender tooth.  No facial swelling.  No pain under the tongue.  No difficulty handling secretions.  No trismus.  Eyes: EOM are normal.  Neck: Normal range of motion.  Cardiovascular: Normal rate, regular rhythm and intact distal pulses.  Pulmonary/Chest: Effort normal and breath sounds normal. No respiratory distress. He has no wheezes.  Abdominal: He exhibits no distension.  Musculoskeletal: Normal range of motion.  Neurological: He is alert and oriented to person, place, and time.  Skin: Skin is warm. No rash noted.  Psychiatric: He has a normal mood and affect.  Nursing note and vitals reviewed.    ED Treatments / Results  Labs (all labs ordered are listed, but only abnormal results are displayed) Labs Reviewed - No  data to display  EKG None  Radiology No results found.  Procedures Procedures (including critical care time)  Medications Ordered in ED Medications - No data to display   Initial Impression / Assessment and Plan / ED Course  I have reviewed the triage vital signs and the nursing notes.  Pertinent labs & imaging results that were available during my care of the patient were reviewed by me and considered in my medical decision making (see chart for details).     Patient presenting for evaluation of dental pain.  Physical exam reassuring, he is afebrile not tachycardic.  Doubt Ludwig's.  Will treat with  antibiotics, NSAIDs, and viscous lidocaine.  Follow-up with dentistry.  At this time, patient present for discharge.  Return precautions given.  Patient states he understands and agrees to plan.   Final Clinical Impressions(s) / ED Diagnoses   Final diagnoses:  Pain, dental    ED Discharge Orders        Ordered    amoxicillin-clavulanate (AUGMENTIN) 875-125 MG tablet  Every 12 hours     08/28/17 0026    naproxen (NAPROSYN) 500 MG tablet  2 times daily with meals     08/28/17 0026    lidocaine (XYLOCAINE) 2 % solution  As needed     08/28/17 0026       Alveria Apley, PA-C 08/28/17 0107    Dione Booze, MD 08/28/17 0730

## 2019-08-22 ENCOUNTER — Ambulatory Visit (HOSPITAL_COMMUNITY)
Admission: EM | Admit: 2019-08-22 | Discharge: 2019-08-22 | Disposition: A | Discharge status: Home / Self Care | Payer: Self-pay | Attending: Family Medicine | Admitting: Family Medicine

## 2019-08-22 ENCOUNTER — Other Ambulatory Visit: Payer: Self-pay

## 2019-08-22 ENCOUNTER — Encounter (HOSPITAL_COMMUNITY): Payer: Self-pay

## 2019-08-22 DIAGNOSIS — R05 Cough: Secondary | ICD-10-CM | POA: Insufficient documentation

## 2019-08-22 DIAGNOSIS — J029 Acute pharyngitis, unspecified: Secondary | ICD-10-CM

## 2019-08-22 DIAGNOSIS — H9209 Otalgia, unspecified ear: Secondary | ICD-10-CM | POA: Insufficient documentation

## 2019-08-22 DIAGNOSIS — Z87891 Personal history of nicotine dependence: Secondary | ICD-10-CM | POA: Insufficient documentation

## 2019-08-22 DIAGNOSIS — Z20822 Contact with and (suspected) exposure to covid-19: Secondary | ICD-10-CM | POA: Insufficient documentation

## 2019-08-22 DIAGNOSIS — J069 Acute upper respiratory infection, unspecified: Secondary | ICD-10-CM

## 2019-08-22 LAB — POCT RAPID STREP A: Streptococcus, Group A Screen (Direct): NEGATIVE

## 2019-08-22 LAB — SARS CORONAVIRUS 2 (TAT 6-24 HRS): SARS Coronavirus 2: NEGATIVE

## 2019-08-22 MED ORDER — IBUPROFEN 800 MG PO TABS: 800.0000 mg | ORAL_TABLET | Freq: Three times a day (TID) | ORAL | 0 refills | Status: DC

## 2019-08-22 MED ORDER — PSEUDOEPH-BROMPHEN-DM 30-2-10 MG/5ML PO SYRP: 5.0000 mL | ORAL_SOLUTION | Freq: Four times a day (QID) | ORAL | 0 refills | Status: DC | PRN

## 2019-08-22 MED ORDER — AMOXICILLIN-POT CLAVULANATE 875-125 MG PO TABS: 1.0000 | ORAL_TABLET | Freq: Two times a day (BID) | ORAL | 0 refills | Status: AC

## 2019-08-22 MED ORDER — FLUTICASONE PROPIONATE 50 MCG/ACT NA SUSP: 1.0000 | Freq: Every day | NASAL | 0 refills | Status: DC

## 2019-08-22 NOTE — ED Provider Notes (Signed)
[facility]    CSN: [id number] Arrival date & time: 08/22/19  1401      History   Chief Complaint Chief Complaint  Patient presents with   Sore Throat    HPI Aaron Scott is a 29 y.o. male presenting today for evaluation of sore throat and ear pain.  Patient reports over the past 3 to 4 days he has had pain in his left ear as well as a sore throat.  Throat pain is more prominent on the left side as well.  He did have some diarrhea and some mild abdominal discomfort earlier in the week, but this is resolved.  He does have a mild cough and congestion as well.  Felt feverish earlier in the week, but this is resolved.  Denies any known close contacts.  HPI  History reviewed. No pertinent past medical history.  There are no problems to display for this patient.   Past Surgical History:  Procedure Laterality Date   ABDOMINAL SURGERY     HERNIA REPAIR         Home Medications    Prior to Admission medications   Medication Sig Start Date End Date Taking? Authorizing Provider  amoxicillin-clavulanate (AUGMENTIN) 875-125 MG tablet Take 1 tablet by mouth every 12 (twelve) hours for 7 days. 08/25/19 09/01/19  Dezmon Conover C, PA-C  brompheniramine-pseudoephedrine-DM 30-2-10 MG/5ML syrup Take 5 mLs by mouth 4 (four) times daily as needed. 08/22/19   Clairissa Valvano C, PA-C  fluticasone (FLONASE) 50 MCG/ACT nasal spray Place 1-2 sprays into both nostrils daily for 7 days. 08/22/19 08/29/19  Varick Keys C, PA-C  ibuprofen (ADVIL) 800 MG tablet Take 1 tablet (800 mg total) by mouth 3 (three) times daily. 08/22/19   Jamie Hafford, Junius Creamer, PA-C    Family History Family History  Problem Relation Age of Onset   Healthy Mother     Social History Social History   Tobacco Use   Smoking status: Former Smoker    Packs/day: 0.50   Smokeless tobacco: Never Used  Substance Use Topics   Alcohol use: Yes    Comment: occasion   Drug use: Yes    Types: Marijuana      Allergies   Latex   Review of Systems Review of Systems  Constitutional: Positive for fatigue and fever. Negative for activity change, appetite change and chills.  HENT: Positive for congestion, ear pain, rhinorrhea and sore throat. Negative for sinus pressure and trouble swallowing.   Eyes: Negative for discharge and redness.  Respiratory: Positive for cough. Negative for chest tightness and shortness of breath.   Cardiovascular: Negative for chest pain.  Gastrointestinal: Negative for abdominal pain, diarrhea, nausea and vomiting.  Musculoskeletal: Negative for myalgias.  Skin: Negative for rash.  Neurological: Negative for dizziness, light-headedness and headaches.     Physical Exam Triage Vital Signs ED Triage Vitals  Enc Vitals Group     BP 08/22/19 1525 136/71     Pulse Rate 08/22/19 1525 77     Resp 08/22/19 1525 18     Temp 08/22/19 1525 98.9 F (37.2 C)     Temp src --      SpO2 08/22/19 1525 97 %     Weight --      Height --      Head Circumference --      Peak Flow --      Pain Score 08/22/19 1523 10     Pain Loc --      Pain  Edu? --      Excl. in GC? --    No data found.  Updated Vital Signs BP 136/71    Pulse 77    Temp 98.9 F (37.2 C)    Resp 18    SpO2 97%   Visual Acuity Right Eye Distance:   Left Eye Distance:   Bilateral Distance:    Right Eye Near:   Left Eye Near:    Bilateral Near:     Physical Exam Vitals and nursing note reviewed.  Constitutional:      Appearance: He is well-developed.     Comments: No acute distress  HENT:     Head: Normocephalic and atraumatic.     Ears:     Comments: Bilateral ears without tenderness to palpation of external auricle, tragus and mastoid, EAC's without erythema or swelling, TM's with good bony landmarks and cone of light.  Very small amount of erythema noted to bilateral anterior TMs    Nose: Nose normal.     Comments: Nasal mucosa mildly erythematous with swollen turbinates bilaterally     Mouth/Throat:     Comments: Oral mucosa pink and moist, no tonsillar enlargement or exudate. Posterior pharynx patent and nonerythematous, no uvula deviation or swelling. Normal phonation. Eyes:     Conjunctiva/sclera: Conjunctivae normal.  Cardiovascular:     Rate and Rhythm: Normal rate.  Pulmonary:     Effort: Pulmonary effort is normal. No respiratory distress.     Comments: Breathing comfortably at rest, CTABL, no wheezing, rales or other adventitious sounds auscultated Abdominal:     General: There is no distension.  Musculoskeletal:        General: Normal range of motion.     Cervical back: Neck supple.  Skin:    General: Skin is warm and dry.  Neurological:     Mental Status: He is alert and oriented to person, place, and time.      UC Treatments / Results  Labs (all labs ordered are listed, but only abnormal results are displayed) Labs Reviewed  SARS CORONAVIRUS 2 (TAT 6-24 HRS)  CULTURE, GROUP A STREP [facility])  POCT RAPID STREP A    EKG   Radiology No results found.  Procedures Procedures (including critical care time)  Medications Ordered in UC Medications - No data to display  Initial Impression / Assessment and Plan / UC Course  I have reviewed the triage vital signs and the nursing notes.  Pertinent labs & imaging results that were available during my care of the patient were reviewed by me and considered in my medical decision making (see chart for details).     URI symptoms x 4 days, vital signs stable, exam unremarkable, strep test negative, Covid pending.  Suspect likely viral etiology and recommending continued symptomatic and supportive care.  Provided printed prescription for Augmentin to fill on Monday if not having any improvement of symptoms to go in and cover for sinusitis/otitis if any symptoms not improving or worsening.  Discussed strict return precautions. Patient verbalized understanding and is agreeable with plan.  Final Clinical  Impressions(s) / UC Diagnoses   Final diagnoses:  Viral URI with cough     Discharge Instructions     Covid test pending, monitor my chart for results Tylenol and ibuprofen for sore throat, headache, body aches Flonase nasal spray 1-2 spray in each nostril daily May use cough syrup as needed for cough/congestion Rest and drink plenty of fluids May fill prescription for Augmentin on Monday if  not seeing any improvement with the above over the weekend Follow-up if any symptoms not improving or worsening    ED Prescriptions    Medication Sig Dispense Auth. Provider   ibuprofen (ADVIL) 800 MG tablet Take 1 tablet (800 mg total) by mouth 3 (three) times daily. 21 tablet Allysia Ingles C, PA-C   amoxicillin-clavulanate (AUGMENTIN) 875-125 MG tablet Take 1 tablet by mouth every 12 (twelve) hours for 7 days. 14 tablet Khaiden Segreto C, PA-C   fluticasone (FLONASE) 50 MCG/ACT nasal spray Place 1-2 sprays into both nostrils daily for 7 days. 1 g Malan Werk C, PA-C   brompheniramine-pseudoephedrine-DM 30-2-10 MG/5ML syrup Take 5 mLs by mouth 4 (four) times daily as needed. 120 mL Gregoria Selvy, [place] C, PA-C     PDMP not reviewed this encounter.   Lew Dawes, PA-C 08/22/19 1609

## 2019-08-22 NOTE — Discharge Instructions (Signed)
Covid test pending, monitor my chart for results Tylenol and ibuprofen for sore throat, headache, body aches Flonase nasal spray 1-2 spray in each nostril daily May use cough syrup as needed for cough/congestion Rest and drink plenty of fluids May fill prescription for Augmentin on Monday if not seeing any improvement with the above over the weekend Follow-up if any symptoms not improving or worsening

## 2019-08-22 NOTE — ED Triage Notes (Addendum)
Pt presents he had some "viral" symptoms such as diarrhea, abdominal pain, and fever x 3 days ago that has subsided. Reports he started having a sore throat, fever, and pain in his left ear when he swallows x 2 days. Reports taking otc medications with minimal relief.

## 2019-08-25 LAB — CULTURE, GROUP A STREP (THRC)

## 2020-07-19 ENCOUNTER — Ambulatory Visit (HOSPITAL_COMMUNITY)
Admission: EM | Admit: 2020-07-19 | Discharge: 2020-07-19 | Disposition: A | Discharge status: Home / Self Care | Payer: Self-pay | Attending: Urgent Care | Admitting: Urgent Care

## 2020-07-19 ENCOUNTER — Encounter (HOSPITAL_COMMUNITY): Payer: Self-pay

## 2020-07-19 DIAGNOSIS — M79642 Pain in left hand: Secondary | ICD-10-CM

## 2020-07-19 DIAGNOSIS — G5603 Carpal tunnel syndrome, bilateral upper limbs: Secondary | ICD-10-CM

## 2020-07-19 DIAGNOSIS — M79641 Pain in right hand: Secondary | ICD-10-CM

## 2020-07-19 LAB — CBG MONITORING, ED: Glucose-Capillary: 108 mg/dL — ABNORMAL HIGH (ref 70–99)

## 2020-07-19 MED ORDER — NAPROXEN 500 MG PO TABS: 500.0000 mg | ORAL_TABLET | Freq: Two times a day (BID) | ORAL | 0 refills | Status: DC

## 2020-07-19 MED ORDER — TIZANIDINE HCL 4 MG PO TABS: 4.0000 mg | ORAL_TABLET | Freq: Every day | ORAL | 0 refills | Status: DC

## 2020-07-19 NOTE — ED Provider Notes (Signed)
[facility]   MRN: [id number] DOB: [birth year]  Subjective:   Aaron Scott is a 30 y.o. male presenting for 3-day history of acute onset persistent bilateral hand numbness, burning and tingling sensation.  Feels this primarily in his fingers but also feels in the palm of his hand.  States that occasionally the pain radiates past the wrist into the forearm.  Denies fever, redness, swelling, neck pain, history of musculoskeletal disorders, falls, trauma.  Patient has not tried any medications for relief.  He works at [employer] and uses his hands and wrists a lot.  Denies any falls, trauma.  No current facility-administered medications for this encounter.  Current Outpatient Medications:  .  brompheniramine-pseudoephedrine-DM 30-2-10 MG/5ML syrup, Take 5 mLs by mouth 4 (four) times daily as needed., Disp: 120 mL, Rfl: 0 .  fluticasone (FLONASE) 50 MCG/ACT nasal spray, Place 1-2 sprays into both nostrils daily for 7 days., Disp: 1 g, Rfl: 0 .  ibuprofen (ADVIL) 800 MG tablet, Take 1 tablet (800 mg total) by mouth 3 (three) times daily., Disp: 21 tablet, Rfl: 0   Allergies  Allergen Reactions  . Latex Swelling    Eyes swell    History reviewed. No pertinent past medical history.   Past Surgical History:  Procedure Laterality Date  . ABDOMINAL SURGERY    . HERNIA REPAIR      Family History  Problem Relation Age of Onset  . Healthy Mother     Social History   Tobacco Use  . Smoking status: Former Smoker    Packs/day: 0.50  . Smokeless tobacco: Never Used  Vaping Use  . Vaping Use: Never used  Substance Use Topics  . Alcohol use: Not Currently    Comment: occasion  . Drug use: Not Currently    ROS   Objective:   Vitals: There were no vitals taken for this visit.  BP Readings from Last 3 Encounters:  08/22/19 136/71  08/28/17 130/89  11/13/16 (!) 135/93   Physical Exam Constitutional:      General: He is not in acute distress.     Appearance: Normal appearance. He is well-developed and normal weight. He is not ill-appearing, toxic-appearing or diaphoretic.  HENT:     Head: Normocephalic and atraumatic.     Right Ear: External ear normal.     Left Ear: External ear normal.     Nose: Nose normal.     Mouth/Throat:     Pharynx: Oropharynx is clear.  Eyes:     General: No scleral icterus.       Right eye: No discharge.        Left eye: No discharge.     Extraocular Movements: Extraocular movements intact.     Pupils: Pupils are equal, round, and reactive to light.  Cardiovascular:     Rate and Rhythm: Normal rate.  Pulmonary:     Effort: Pulmonary effort is normal.  Musculoskeletal:     Right forearm: No swelling, edema, deformity, lacerations, tenderness or bony tenderness.     Left forearm: No swelling, edema, deformity, lacerations, tenderness or bony tenderness.     Right wrist: No swelling, deformity, effusion, lacerations, tenderness, bony tenderness, snuff box tenderness or crepitus. Normal range of motion.     Left wrist: No swelling, deformity, effusion, lacerations, tenderness, bony tenderness, snuff box tenderness or crepitus. Normal range of motion.     Right hand: No swelling, deformity, lacerations, tenderness or bony tenderness. Normal range of motion. Normal  strength. Normal sensation. Normal capillary refill.     Left hand: No swelling, deformity, lacerations, tenderness or bony tenderness. Normal range of motion. Normal strength. Normal sensation. Normal capillary refill.     Cervical back: Normal range of motion.     Comments: Positive Phalen test for both wrists.  Skin:    General: Skin is warm and dry.  Neurological:     Mental Status: He is alert and oriented to person, place, and time.  Psychiatric:        Mood and Affect: Mood normal.        Behavior: Behavior normal.        Thought Content: Thought content normal.        Judgment: Judgment normal.     Results for orders placed or  performed during the hospital encounter of 07/19/20 (from the past 24 hour(s))  POC CBG monitoring     Status: Abnormal   Collection Time: 07/19/20  6:06 PM  Result Value Ref Range   Glucose-Capillary 108 (H) 70 - 99 mg/dL    Assessment and Plan :   PDMP not reviewed this encounter.  1. Bilateral carpal tunnel syndrome   2. Bilateral hand pain     Start naproxen for pain and inflammation. Wear wrist splints at night. Follow up with sports med. Counseled patient on potential for adverse effects with medications prescribed/recommended today, ER and return-to-clinic precautions discussed, patient verbalized understanding.    Wallis Bamberg, [place] 07/19/20 1840

## 2020-07-19 NOTE — ED Triage Notes (Signed)
Pt presents with bilateral hand numbness x 3 days. Pt states he feels a burning sensation in his fingers sometimes. He states the burning sensation sometimes moves to the right arm.

## 2020-08-02 ENCOUNTER — Other Ambulatory Visit: Payer: Self-pay

## 2020-08-02 ENCOUNTER — Encounter (HOSPITAL_COMMUNITY): Payer: Self-pay

## 2020-08-02 ENCOUNTER — Emergency Department (HOSPITAL_COMMUNITY): Payer: Self-pay

## 2020-08-02 ENCOUNTER — Emergency Department (HOSPITAL_COMMUNITY)
Admission: EM | Admit: 2020-08-02 | Discharge: 2020-08-02 | Disposition: A | Discharge status: Home / Self Care | Payer: Self-pay | Attending: Emergency Medicine | Admitting: Emergency Medicine

## 2020-08-02 DIAGNOSIS — S21202A Unspecified open wound of left back wall of thorax without penetration into thoracic cavity, initial encounter: Secondary | ICD-10-CM | POA: Insufficient documentation

## 2020-08-02 DIAGNOSIS — Z87891 Personal history of nicotine dependence: Secondary | ICD-10-CM | POA: Insufficient documentation

## 2020-08-02 DIAGNOSIS — Z91040 Latex allergy status: Secondary | ICD-10-CM | POA: Insufficient documentation

## 2020-08-02 DIAGNOSIS — R000 Tachycardia, unspecified: Secondary | ICD-10-CM | POA: Insufficient documentation

## 2020-08-02 DIAGNOSIS — W3400XA Accidental discharge from unspecified firearms or gun, initial encounter: Secondary | ICD-10-CM | POA: Insufficient documentation

## 2020-08-02 MED ORDER — TETANUS-DIPHTH-ACELL PERTUSSIS 5-2.5-18.5 LF-MCG/0.5 IM SUSY: 0.5000 mL | PREFILLED_SYRINGE | Freq: Once | INTRAMUSCULAR | Status: DC
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
## 2020-08-02 NOTE — Discharge Instructions (Addendum)
1. Medications: usual home medications 2. Treatment: rest, drink plenty of fluids, keep wound clean and dry 3. Follow Up: Please followup with your primary doctor in 2-3 days for wound check; Please return to the ER for signs of infection, worsening pain or other concerns

## 2020-09-12 ENCOUNTER — Emergency Department (HOSPITAL_COMMUNITY)
Admission: EM | Admit: 2020-09-12 | Discharge: 2020-09-13 | Disposition: A | Discharge status: Home / Self Care | Payer: BC Managed Care – PPO | Attending: Emergency Medicine | Admitting: Emergency Medicine

## 2020-09-12 ENCOUNTER — Other Ambulatory Visit: Payer: Self-pay

## 2020-09-12 ENCOUNTER — Encounter (HOSPITAL_COMMUNITY): Payer: Self-pay

## 2020-09-12 DIAGNOSIS — T310 Burns involving less than 10% of body surface: Secondary | ICD-10-CM | POA: Insufficient documentation

## 2020-09-12 DIAGNOSIS — X12XXXA Contact with other hot fluids, initial encounter: Secondary | ICD-10-CM | POA: Diagnosis not present

## 2020-09-12 DIAGNOSIS — Z91040 Latex allergy status: Secondary | ICD-10-CM | POA: Diagnosis not present

## 2020-09-12 DIAGNOSIS — T300 Burn of unspecified body region, unspecified degree: Secondary | ICD-10-CM

## 2020-09-12 DIAGNOSIS — Y93G3 Activity, cooking and baking: Secondary | ICD-10-CM | POA: Diagnosis not present

## 2020-09-12 DIAGNOSIS — Z87891 Personal history of nicotine dependence: Secondary | ICD-10-CM | POA: Diagnosis not present

## 2020-09-12 DIAGNOSIS — T2112XA Burn of first degree of abdominal wall, initial encounter: Secondary | ICD-10-CM | POA: Diagnosis not present

## 2020-09-12 DIAGNOSIS — T2102XA Burn of unspecified degree of abdominal wall, initial encounter: Secondary | ICD-10-CM | POA: Diagnosis present

## 2020-09-12 DIAGNOSIS — Y929 Unspecified place or not applicable: Secondary | ICD-10-CM | POA: Insufficient documentation

## 2020-09-12 MED ORDER — SILVER SULFADIAZINE 1 % EX CREA
TOPICAL_CREAM | Freq: Once | CUTANEOUS | Status: AC
  Filled 2020-09-12: qty 50

## 2020-09-12 MED ORDER — HYDROCODONE-ACETAMINOPHEN 5-325 MG PO TABS: 1.0000 | ORAL_TABLET | ORAL | 0 refills | Status: DC | PRN
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
## 2020-09-12 NOTE — Discharge Instructions (Addendum)
Take the prescribed medication as directed.  Can take motrin with this.  Continue using silvadene twice daily to affected area. Follow-up with your primary care doctor. Return to the ED for new or worsening symptoms.

## 2021-05-29 ENCOUNTER — Encounter (HOSPITAL_BASED_OUTPATIENT_CLINIC_OR_DEPARTMENT_OTHER): Payer: Self-pay | Admitting: Emergency Medicine

## 2021-05-29 ENCOUNTER — Emergency Department (HOSPITAL_BASED_OUTPATIENT_CLINIC_OR_DEPARTMENT_OTHER)
Admission: EM | Admit: 2021-05-29 | Discharge: 2021-05-29 | Disposition: A | Discharge status: Home / Self Care | Payer: No Typology Code available for payment source | Attending: Emergency Medicine | Admitting: Emergency Medicine

## 2021-05-29 ENCOUNTER — Emergency Department (HOSPITAL_BASED_OUTPATIENT_CLINIC_OR_DEPARTMENT_OTHER): Payer: No Typology Code available for payment source

## 2021-05-29 ENCOUNTER — Other Ambulatory Visit: Payer: Self-pay

## 2021-05-29 DIAGNOSIS — S80811A Abrasion, right lower leg, initial encounter: Secondary | ICD-10-CM | POA: Insufficient documentation

## 2021-05-29 DIAGNOSIS — W231XXA Caught, crushed, jammed, or pinched between stationary objects, initial encounter: Secondary | ICD-10-CM | POA: Insufficient documentation

## 2021-05-29 DIAGNOSIS — M79604 Pain in right leg: Secondary | ICD-10-CM

## 2021-05-29 DIAGNOSIS — S8991XA Unspecified injury of right lower leg, initial encounter: Secondary | ICD-10-CM | POA: Diagnosis present
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
## 2021-05-29 NOTE — Discharge Instructions (Signed)
Take 4 over the counter ibuprofen tablets 3 times a day or 2 over-the-counter naproxen tablets twice a day for pain. Also take tylenol 1000mg(2 extra strength) four times a day.    

## 2022-08-23 ENCOUNTER — Ambulatory Visit (HOSPITAL_COMMUNITY)
Admission: EM | Admit: 2022-08-23 | Discharge: 2022-08-23 | Disposition: A | Discharge status: Home / Self Care | Payer: Self-pay | Attending: Nurse Practitioner | Admitting: Nurse Practitioner

## 2022-08-23 ENCOUNTER — Encounter (HOSPITAL_COMMUNITY): Payer: Self-pay

## 2022-08-23 DIAGNOSIS — R197 Diarrhea, unspecified: Secondary | ICD-10-CM

## 2022-08-23 DIAGNOSIS — R110 Nausea: Secondary | ICD-10-CM

## 2022-08-23 MED ORDER — ONDANSETRON 4 MG PO TBDP: 4.0000 mg | ORAL_TABLET | Freq: Three times a day (TID) | ORAL | 0 refills | Status: DC | PRN

## 2022-08-23 NOTE — ED Triage Notes (Signed)
Patient c/o mid abdominal pain and diarrhea x 1 week.  Patient denies taking any medication for his symptoms.

## 2022-08-23 NOTE — ED Provider Notes (Signed)
[facility]    CSN: [id number] Arrival date & time: 08/23/22  1319      History   Chief Complaint Chief Complaint  Patient presents with   Abdominal Pain    HPI Aaron Scott is a 32 y.o. male.   Patient presents today for 5-day history of diarrhea.  He reports 3-4 episodes of nonbloody diarrhea with some formed stool, not pure water.  Also has been a little bit nauseous, however no vomiting.  Reports he has upper abdominal cramping immediately prior to the diarrhea.  No other abdominal pain otherwise.  No fever, change in appetite, loss of taste or smell, recent foreign travel, recent ingestion of suspicious drinking water, recent antibiotic use, or known sick contacts.  Patient does not remember what he ate prior to symptoms starting.  No vomiting.  Has not taken anything for symptoms so far.  Reports he had to call out of work last night and is requesting a work note.    History reviewed. No pertinent past medical history.  There are no problems to display for this patient.   Past Surgical History:  Procedure Laterality Date   ABDOMINAL SURGERY     HERNIA REPAIR         Home Medications    Prior to Admission medications   Medication Sig Start Date End Date Taking? Authorizing Provider  ondansetron (ZOFRAN-ODT) 4 MG disintegrating tablet Take 1 tablet (4 mg total) by mouth every 8 (eight) hours as needed for nausea or vomiting. 08/23/22  Yes Valentino Nose, NP  fluticasone (FLONASE) 50 MCG/ACT nasal spray Place 1-2 sprays into both nostrils daily for 7 days. 08/22/19 08/29/19  Wieters, Junius Creamer, PA-C    Family History Family History  Problem Relation Age of Onset   Healthy Mother     Social History Social History   Tobacco Use   Smoking status: Former    Packs/day: .5    Types: Cigarettes   Smokeless tobacco: Never  Vaping Use   Vaping Use: Never used  Substance Use Topics   Alcohol use: Not Currently    Comment: occasion   Drug use:  Not Currently     Allergies   Latex   Review of Systems Review of Systems Per HPI  Physical Exam Triage Vital Signs ED Triage Vitals  Enc Vitals Group     BP 08/23/22 1327 125/83     Pulse Rate 08/23/22 1327 89     Resp 08/23/22 1327 16     Temp 08/23/22 1327 98.4 F (36.9 C)     Temp Source 08/23/22 1327 Oral     SpO2 08/23/22 1327 97 %     Weight --      Height --      Head Circumference --      Peak Flow --      Pain Score 08/23/22 1329 8     Pain Loc --      Pain Edu? --      Excl. in GC? --    No data found.  Updated Vital Signs BP 125/83 (BP Location: Left Arm)   Pulse 89   Temp 98.4 F (36.9 C) (Oral)   Resp 16   SpO2 97%   Visual Acuity Right Eye Distance:   Left Eye Distance:   Bilateral Distance:    Right Eye Near:   Left Eye Near:    Bilateral Near:     Physical Exam Vitals and nursing note reviewed.  Constitutional:  General: He is not in acute distress.    Appearance: Normal appearance. He is not toxic-appearing.  HENT:     Head: Normocephalic and atraumatic.     Mouth/Throat:     Mouth: Mucous membranes are moist.     Pharynx: Oropharynx is clear. No posterior oropharyngeal erythema.  Cardiovascular:     Rate and Rhythm: Normal rate and regular rhythm.  Pulmonary:     Effort: Pulmonary effort is normal. No respiratory distress.     Breath sounds: Normal breath sounds. No wheezing, rhonchi or rales.  Abdominal:     General: Abdomen is flat. Bowel sounds are normal. There is no distension.     Palpations: Abdomen is soft.     Tenderness: There is no abdominal tenderness. There is no right CVA tenderness, left CVA tenderness, guarding or rebound. Negative signs include Murphy's sign, Rovsing's sign and McBurney's sign.  Musculoskeletal:     Cervical back: Normal range of motion.  Lymphadenopathy:     Cervical: No cervical adenopathy.  Skin:    General: Skin is warm and dry.     Capillary Refill: Capillary refill takes less  than 2 seconds.     Coloration: Skin is not jaundiced or pale.     Findings: No erythema.  Neurological:     Mental Status: He is alert and oriented to person, place, and time.     Motor: No weakness.     Gait: Gait normal.  Psychiatric:        Behavior: Behavior is cooperative.      UC Treatments / Results  Labs (all labs ordered are listed, but only abnormal results are displayed) Labs Reviewed - No data to display  EKG   Radiology No results found.  Procedures Procedures (including critical care time)  Medications Ordered in UC Medications - No data to display  Initial Impression / Assessment and Plan / UC Course  I have reviewed the triage vital signs and the nursing notes.  Pertinent labs & imaging results that were available during my care of the patient were reviewed by me and considered in my medical decision making (see chart for details).   Patient is well-appearing, normotensive, afebrile, not tachycardic, not tachypneic, oxygenating well on room air.    1. Diarrhea, unspecified type 2. Nausea without vomiting Suspect possible viral stomach bug versus mild food poisoning Vitals and exam today are reassuring Encouraged increasing hydration with plenty of fluids, sugar-free electrolyte solutions Start Zofran as needed for nausea/vomiting Recommended Imodium to help with diarrhea although does not sound like true diarrhea Strict ER precautions discussed with patient Note given for work  The patient was given the opportunity to ask questions.  All questions answered to their satisfaction.  The patient is in agreement to this plan.    Final Clinical Impressions(s) / UC Diagnoses   Final diagnoses:  Diarrhea, unspecified type  Nausea without vomiting     Discharge Instructions      The diarrhea and nausea should improve over the next couple of days.  You can use the Zofran every 8 hours as needed for nausea/vomiting.  To help with the diarrhea, you can  take over-the-counter Imodium.  Please push hydration with plenty of water and/or sugar-free electrolytes.  You can eat soft, easy to digest foods for the next couple of days.  Please seek care in the emergency room if you develop blood in your vomit or stool or severe abdominal pain.     ED Prescriptions  Medication Sig Dispense Auth. Provider   ondansetron (ZOFRAN-ODT) 4 MG disintegrating tablet Take 1 tablet (4 mg total) by mouth every 8 (eight) hours as needed for nausea or vomiting. 20 tablet Valentino Nose, NP      PDMP not reviewed this encounter.   Valentino Nose, NP 08/23/22 [phone]

## 2022-08-23 NOTE — Discharge Instructions (Signed)
The diarrhea and nausea should improve over the next couple of days.  You can use the Zofran every 8 hours as needed for nausea/vomiting.  To help with the diarrhea, you can take over-the-counter Imodium.  Please push hydration with plenty of water and/or sugar-free electrolytes.  You can eat soft, easy to digest foods for the next couple of days.  Please seek care in the emergency room if you develop blood in your vomit or stool or severe abdominal pain.

## 2022-12-17 ENCOUNTER — Ambulatory Visit (INDEPENDENT_AMBULATORY_CARE_PROVIDER_SITE_OTHER): Payer: Self-pay

## 2022-12-17 ENCOUNTER — Encounter (HOSPITAL_COMMUNITY): Payer: Self-pay | Admitting: Emergency Medicine

## 2022-12-17 ENCOUNTER — Ambulatory Visit (HOSPITAL_COMMUNITY)
Admission: EM | Admit: 2022-12-17 | Discharge: 2022-12-17 | Disposition: A | Discharge status: Home / Self Care | Payer: Self-pay | Attending: Family Medicine | Admitting: Family Medicine

## 2022-12-17 ENCOUNTER — Other Ambulatory Visit: Payer: Self-pay

## 2022-12-17 DIAGNOSIS — K219 Gastro-esophageal reflux disease without esophagitis: Secondary | ICD-10-CM

## 2022-12-17 DIAGNOSIS — R072 Precordial pain: Secondary | ICD-10-CM

## 2022-12-17 MED ORDER — DICYCLOMINE HCL 20 MG PO TABS: 20.0000 mg | ORAL_TABLET | Freq: Four times a day (QID) | ORAL | 0 refills | Status: AC | PRN

## 2022-12-17 MED ORDER — OMEPRAZOLE 40 MG PO CPDR: 40.0000 mg | DELAYED_RELEASE_CAPSULE | Freq: Every day | ORAL | 1 refills | Status: AC

## 2022-12-17 NOTE — Discharge Instructions (Signed)
Your EKG did not show any abnormalities Your chest x-ray by my review is normal. The radiologist will also read your x-ray, and if their interpretation differs significantly from mine, we will call you.  Take omeprazole 40 mg--1 capsule daily for stomach acid.  Dicyclomine--take 1 every 6 hours as needed for intestinal cramps--if your symptoms are related to spasm of the esophagus this might help  You can use the QR code/website at the back of the summary paperwork to schedule yourself a new patient appointment with primary care  If you worsen in any way please go to the emergency room.   Please reduce your red bull intake to maybe 1 daily.

## 2022-12-17 NOTE — ED Provider Notes (Signed)
[facility]    CSN: [id number] Arrival date & time: 12/17/22  1400      History   Chief Complaint Chief Complaint  Patient presents with   Chest Pain    HPI Aaron Scott is a 32 y.o. male.    Chest Pain Here for chest tightness in the last 24 hours. Maybe worse with deep breath. Maybe worse when walking around.  No associated diaphoresis, n/v, fever/cough/congestion.  He does have some burning in his chest after eating certain things.   Appetite has been good.  Maybe some stress in life.    History reviewed. No pertinent past medical history.  There are no problems to display for this patient.   Past Surgical History:  Procedure Laterality Date   ABDOMINAL SURGERY     HERNIA REPAIR         Home Medications    Prior to Admission medications   Medication Sig Start Date End Date Taking? Authorizing Provider  dicyclomine (BENTYL) 20 MG tablet Take 1 tablet (20 mg total) by mouth 4 (four) times daily as needed (intestinal cramps). 12/17/22  Yes Zenia Resides, MD  omeprazole (PRILOSEC) 40 MG capsule Take 1 capsule (40 mg total) by mouth daily. 12/17/22  Yes Annalissa Murphey, Janace Aris, MD    Family History Family History  Problem Relation Age of Onset   Healthy Mother     Social History Social History   Tobacco Use   Smoking status: Former    Current packs/day: 0.50    Types: Cigarettes   Smokeless tobacco: Never  Vaping Use   Vaping status: Never Used  Substance Use Topics   Alcohol use: Not Currently    Comment: occasion   Drug use: Not Currently     Allergies   Latex   Review of Systems Review of Systems  Cardiovascular:  Positive for chest pain.     Physical Exam Triage Vital Signs ED Triage Vitals  Encounter Vitals Group     BP 12/17/22 1415 111/80     Systolic BP Percentile --      Diastolic BP Percentile --      Pulse Rate 12/17/22 1415 79     Resp 12/17/22 1415 20     Temp 12/17/22 1415 98.1 F (36.7 C)      Temp Source 12/17/22 1415 Oral     SpO2 12/17/22 1415 97 %     Weight --      Height --      Head Circumference --      Peak Flow --      Pain Score 12/17/22 1413 7     Pain Loc --      Pain Education --      Exclude from Growth Chart --    No data found.  Updated Vital Signs BP 111/80 (BP Location: Right Arm) Comment (BP Location): large cuff  Pulse 79   Temp 98.1 F (36.7 C) (Oral)   Resp 20   SpO2 97%   Visual Acuity Right Eye Distance:   Left Eye Distance:   Bilateral Distance:    Right Eye Near:   Left Eye Near:    Bilateral Near:     Physical Exam Vitals reviewed.  Constitutional:      General: He is not in acute distress.    Appearance: He is not ill-appearing, toxic-appearing or diaphoretic.  HENT:     Mouth/Throat:     Mouth: Mucous membranes are moist.  Eyes:  Conjunctiva/sclera: Conjunctivae normal.     Pupils: Pupils are equal, round, and reactive to light.  Cardiovascular:     Rate and Rhythm: Normal rate and regular rhythm.     Heart sounds: No murmur heard. Pulmonary:     Effort: Pulmonary effort is normal. No respiratory distress.     Breath sounds: Normal breath sounds. No stridor. No wheezing, rhonchi or rales.  Chest:     Chest wall: No tenderness.  Abdominal:     General: Bowel sounds are normal.     Palpations: Abdomen is soft.     Tenderness: There is no abdominal tenderness.  Musculoskeletal:     Cervical back: Neck supple.  Lymphadenopathy:     Cervical: No cervical adenopathy.  Skin:    Capillary Refill: Capillary refill takes less than 2 seconds.     Coloration: Skin is not pale.  Neurological:     General: No focal deficit present.     Mental Status: He is alert.  Psychiatric:        Behavior: Behavior normal.      UC Treatments / Results  Labs (all labs ordered are listed, but only abnormal results are displayed) Labs Reviewed - No data to display  EKG   Radiology No results found.  Procedures Procedures  (including critical care time)  Medications Ordered in UC Medications - No data to display  Initial Impression / Assessment and Plan / UC Course  I have reviewed the triage vital signs and the nursing notes.  Pertinent labs & imaging results that were available during my care of the patient were reviewed by me and considered in my medical decision making (see chart for details).     EKG shows normal sinus rhythm and no significant ST-T segment changes  Chest x-ray by my review does not show any widened mediastinum or abnormality to explain his tightness in his chest.  Vital signs are reassuring and normal.  Of note his blood pressure was 111/80 and his heart rate is normal at 79  Omeprazole is sent in as is dicyclomine.  He is given instructions on how to set up care with a primary care provider.  If he worsens anyway he is to go to the emergency room   Final Clinical Impressions(s) / UC Diagnoses   Final diagnoses:  Precordial pain  Gastroesophageal reflux disease without esophagitis     Discharge Instructions      Your EKG did not show any abnormalities Your chest x-ray by my review is normal. The radiologist will also read your x-ray, and if their interpretation differs significantly from mine, we will call you.  Take omeprazole 40 mg--1 capsule daily for stomach acid.  Dicyclomine--take 1 every 6 hours as needed for intestinal cramps--if your symptoms are related to spasm of the esophagus this might help  You can use the QR code/website at the back of the summary paperwork to schedule yourself a new patient appointment with primary care  If you worsen in any way please go to the emergency room.   Please reduce your red bull intake to maybe 1 daily.       ED Prescriptions     Medication Sig Dispense Auth. Provider   omeprazole (PRILOSEC) 40 MG capsule Take 1 capsule (40 mg total) by mouth daily. 30 capsule Zenia Resides, MD   dicyclomine (BENTYL) 20 MG  tablet Take 1 tablet (20 mg total) by mouth 4 (four) times daily as needed (intestinal cramps). 20 tablet Lula Michaux,  Janace Aris, MD      PDMP not reviewed this encounter.   Zenia Resides, MD 12/17/22 1500

## 2022-12-17 NOTE — ED Triage Notes (Signed)
Reports chest tightness since yesterday.  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  Reports he drinks about 2 red bulls a day.  Patient feels tightness in center chest.  Reports breathing in deeply causes discomfort.  Patient reports breathing normal-walking from lobby did not cause any symptoms.  Patient has not taken any medications

## 2023-01-31 ENCOUNTER — Emergency Department (HOSPITAL_COMMUNITY): Payer: Self-pay

## 2023-01-31 ENCOUNTER — Emergency Department (HOSPITAL_COMMUNITY)
Admission: EM | Admit: 2023-01-31 | Discharge: 2023-02-01 | Disposition: A | Discharge status: Home / Self Care | Payer: Self-pay | Attending: Emergency Medicine | Admitting: Emergency Medicine

## 2023-01-31 ENCOUNTER — Other Ambulatory Visit: Payer: Self-pay

## 2023-01-31 DIAGNOSIS — R0789 Other chest pain: Secondary | ICD-10-CM | POA: Insufficient documentation

## 2023-01-31 DIAGNOSIS — Z91040 Latex allergy status: Secondary | ICD-10-CM | POA: Insufficient documentation

## 2023-01-31 LAB — BASIC METABOLIC PANEL
Anion gap: 9 (ref 5–15)
BUN: 13 mg/dL (ref 6–20)
CO2: 24 mmol/L (ref 22–32)
Calcium: 9.3 mg/dL (ref 8.9–10.3)
Chloride: 104 mmol/L (ref 98–111)
Creatinine, Ser: 1.13 mg/dL (ref 0.61–1.24)
GFR, Estimated: >60 mL/min (ref >60)
Glucose, Bld: 90 mg/dL (ref 70–99)
Potassium: 3.5 mmol/L (ref 3.5–5.1)
Sodium: 137 mmol/L (ref 135–145)

## 2023-01-31 LAB — TROPONIN I (HIGH SENSITIVITY): Troponin I (High Sensitivity): <2 ng/L (ref <18)

## 2023-01-31 LAB — CBC
HCT: 45.1 % (ref 39.0–52.0)
Hemoglobin: 15.9 g/dL (ref 13.0–17.0)
MCH: 29.8 pg (ref 26.0–34.0)
MCHC: 35.3 g/dL (ref 30.0–36.0)
MCV: 84.5 fL (ref 80.0–100.0)
Platelets: 317 10*3/uL (ref 150–400)
RBC: 5.34 MIL/uL (ref 4.22–5.81)
RDW: 11.9 % (ref 11.5–15.5)
WBC: 8.6 10*3/uL (ref 4.0–10.5)
nRBC: 0 % (ref 0.0–0.2)

## 2023-02-01 ENCOUNTER — Encounter (HOSPITAL_COMMUNITY): Payer: Self-pay

## 2023-02-01 LAB — D-DIMER, QUANTITATIVE: D-Dimer, Quant: <0.27 ug{FEU}/mL (ref 0.00–0.50)

## 2023-02-01 LAB — TROPONIN I (HIGH SENSITIVITY): Troponin I (High Sensitivity): <2 ng/L (ref <18)

## 2023-02-01 MED ORDER — IBUPROFEN 800 MG PO TABS: 800.0000 mg | ORAL_TABLET | Freq: Four times a day (QID) | ORAL | 0 refills | Status: DC | PRN

## 2023-02-01 NOTE — ED Provider Notes (Signed)
[place] EMERGENCY DEPARTMENT AT [facility] Provider Note   CSN: [id number] Arrival date & time: 01/31/23  1818     History  Chief Complaint  Patient presents with   Chest Pain    Aaron Scott is a 32 y.o. male.  Patient presents to the emergency department for evaluation of left central chest pain.  Patient reports that the symptoms have been ongoing for 2 weeks.  Pain has been persistent, has never resolved during the 2 weeks time.  He notices that pain is worse when he turns from side-to-side or stretches the chest, especially when lying flat.  Denies any direct injury.  No associated shortness of breath.       Home Medications Prior to Admission medications   Medication Sig Start Date End Date Taking? Authorizing Provider  ibuprofen (ADVIL) 800 MG tablet Take 1 tablet (800 mg total) by mouth every 6 (six) hours as needed for moderate pain (pain score 4-6). 02/01/23  Yes Arvis Zwahlen, Canary Brim, MD  dicyclomine (BENTYL) 20 MG tablet Take 1 tablet (20 mg total) by mouth 4 (four) times daily as needed (intestinal cramps). 12/17/22   Zenia Resides, MD  omeprazole (PRILOSEC) 40 MG capsule Take 1 capsule (40 mg total) by mouth daily. 12/17/22   Zenia Resides, MD      Allergies    Latex    Review of Systems   Review of Systems  Physical Exam Updated Vital Signs BP (!) 155/91 (BP Location: Right Arm)   Pulse 83   Temp 98.1 F (36.7 C) (Oral)   Resp 16   Ht 5\' 6"  (1.676 m)   Wt 120.2 kg   SpO2 98%   BMI 42.77 kg/m  Physical Exam Vitals and nursing note reviewed.  Constitutional:      General: He is not in acute distress.    Appearance: He is well-developed.  HENT:     Head: Normocephalic and atraumatic.     Mouth/Throat:     Mouth: Mucous membranes are moist.  Eyes:     General: Vision grossly intact. Gaze aligned appropriately.     Extraocular Movements: Extraocular movements intact.     Conjunctiva/sclera: Conjunctivae normal.   Cardiovascular:     Rate and Rhythm: Normal rate and regular rhythm.     Pulses: Normal pulses.     Heart sounds: Normal heart sounds, S1 normal and S2 normal. No murmur heard.    No friction rub. No gallop.  Pulmonary:     Effort: Pulmonary effort is normal. No respiratory distress.     Breath sounds: Normal breath sounds.  Abdominal:     Palpations: Abdomen is soft.     Tenderness: There is no abdominal tenderness. There is no guarding or rebound.     Hernia: No hernia is present.  Musculoskeletal:        General: No swelling.     Cervical back: Full passive range of motion without pain, normal range of motion and neck supple. No pain with movement, spinous process tenderness or muscular tenderness. Normal range of motion.     Right lower leg: No edema.     Left lower leg: No edema.  Skin:    General: Skin is warm and dry.     Capillary Refill: Capillary refill takes less than 2 seconds.     Findings: No ecchymosis, erythema, lesion or wound.  Neurological:     Mental Status: He is alert and oriented to person, place, and time.  GCS: GCS eye subscore is 4. GCS verbal subscore is 5. GCS motor subscore is 6.     Cranial Nerves: Cranial nerves 2-12 are intact.     Sensory: Sensation is intact.     Motor: Motor function is intact. No weakness or abnormal muscle tone.     Coordination: Coordination is intact.  Psychiatric:        Mood and Affect: Mood normal.        Speech: Speech normal.        Behavior: Behavior normal.     ED Results / Procedures / Treatments   Labs (all labs ordered are listed, but only abnormal results are displayed) Labs Reviewed  BASIC METABOLIC PANEL  CBC  D-DIMER, QUANTITATIVE  TROPONIN I (HIGH SENSITIVITY)  TROPONIN I (HIGH SENSITIVITY)    EKG EKG Interpretation Date/Time:  Wednesday January 31 2023 18:28:42 EST Ventricular Rate:  91 PR Interval:  160 QRS Duration:  76 QT Interval:  331 QTC Calculation: 408 R Axis:   22  Text  Interpretation: Sinus rhythm Normal ECG Confirmed by Gilda Crease [phone]) on 02/01/2023 3:07:42 AM  Radiology DG Chest 2 View Result Date: 01/31/2023 CLINICAL DATA:  Chest pain EXAM: CHEST - 2 VIEW COMPARISON:  Chest x-ray 12/17/2022 FINDINGS: The heart size and mediastinal contours are within normal limits. Both lungs are clear. The visualized skeletal structures are unremarkable. IMPRESSION: No active cardiopulmonary disease. Electronically Signed   By: Darliss Cheney M.D.   On: 01/31/2023 20:17    Procedures Procedures    Medications Ordered in ED Medications - No data to display  ED Course/ Medical Decision Making/ A&P                                 Medical Decision Making Amount and/or Complexity of Data Reviewed External Data Reviewed: notes. Labs: ordered. Decision-making details documented in ED Course. Radiology: ordered and independent interpretation performed. Decision-making details documented in ED Course. ECG/medicine tests: ordered and independent interpretation performed. Decision-making details documented in ED Course.   Differential Diagnosis considered includes, but not limited to: STEMI; NSTEMI; myocarditis; pericarditis; pulmonary embolism; aortic dissection; pneumothorax; pneumonia; gastritis; musculoskeletal pain  Presents to the emergency department for evaluation of chest pain ongoing for 2 weeks.  Presentation is somewhat atypical, pain is reproducible with movements of the chest wall.  He reports that he thought he had a pulled muscle but symptoms have worsened.  EKG is normal.  Troponin negative x 2.  Patient did have some mild tachycardia initially, did resolve.  Wells criteria low probability for PE.  D-dimer negative.  No further workup necessary.        Final Clinical Impression(s) / ED Diagnoses Final diagnoses:  Chest wall pain    Rx / DC Orders ED Discharge Orders          Ordered    ibuprofen (ADVIL) 800 MG tablet  Every 6  hours PRN        02/01/23 0437              Gilda Crease, MD 02/01/23 [phone]

## 2023-05-12 ENCOUNTER — Ambulatory Visit (HOSPITAL_COMMUNITY)
Admission: EM | Admit: 2023-05-12 | Discharge: 2023-05-12 | Disposition: A | Discharge status: Home / Self Care | Payer: Self-pay | Attending: Emergency Medicine | Admitting: Emergency Medicine

## 2023-05-12 ENCOUNTER — Encounter (HOSPITAL_COMMUNITY): Payer: Self-pay

## 2023-05-12 DIAGNOSIS — J111 Influenza due to unidentified influenza virus with other respiratory manifestations: Secondary | ICD-10-CM

## 2023-05-12 LAB — POC COVID19/FLU A&B COMBO
Covid Antigen, POC: NEGATIVE
Influenza A Antigen, POC: NEGATIVE
Influenza B Antigen, POC: NEGATIVE

## 2023-05-12 MED ORDER — OSELTAMIVIR PHOSPHATE 75 MG PO CAPS: 75.0000 mg | ORAL_CAPSULE | Freq: Two times a day (BID) | ORAL | 0 refills | Status: AC

## 2023-05-12 NOTE — ED Triage Notes (Signed)
 Patient here today with c/o cough, weakness, sweats, diarrhea, runny nose, chest soreness, wheeze, and SOB X 1 day. Patient's daughter was diagnosed with the flu yesterday. He took Theraflu last night with no relief.

## 2023-05-12 NOTE — ED Provider Notes (Signed)
 [facility]    CSN: [id number] Arrival date & time: 05/12/23  1007      History   Chief Complaint Chief Complaint  Patient presents with   Cough    HPI Aaron Scott is a 33 y.o. male.   Patient presents with cough, fatigue, sweats, diarrhea, runny nose, chest congestion, and intermittent wheezing and mild shortness of breath.  Denies abdominal pain, vomiting, and known fever.  Patient states that daughter was diagnosed with flu yesterday.  Patient states he took TheraFlu last night with minimal relief.   Cough   History reviewed. No pertinent past medical history.  There are no active problems to display for this patient.   Past Surgical History:  Procedure Laterality Date   ABDOMINAL SURGERY     HERNIA REPAIR         Home Medications    Prior to Admission medications   Medication Sig Start Date End Date Taking? Authorizing Provider  oseltamivir (TAMIFLU) 75 MG capsule Take 1 capsule (75 mg total) by mouth every 12 (twelve) hours. 05/12/23  Yes Susann Givens, Nathaly Dawkins A, NP  dicyclomine (BENTYL) 20 MG tablet Take 1 tablet (20 mg total) by mouth 4 (four) times daily as needed (intestinal cramps). Patient not taking: Reported on 05/12/2023 12/17/22   Zenia Resides, MD  omeprazole (PRILOSEC) 40 MG capsule Take 1 capsule (40 mg total) by mouth daily. Patient not taking: Reported on 05/12/2023 12/17/22   Zenia Resides, MD    Family History Family History  Problem Relation Age of Onset   Healthy Mother     Social History Social History   Tobacco Use   Smoking status: Former    Current packs/day: 0.50    Types: Cigarettes   Smokeless tobacco: Never  Vaping Use   Vaping status: Never Used  Substance Use Topics   Alcohol use: Not Currently    Comment: occasion   Drug use: Not Currently     Allergies   Latex   Review of Systems Review of Systems  Respiratory:  Positive for cough.    Per HPI  Physical Exam Triage Vital  Signs ED Triage Vitals  Encounter Vitals Group     BP 05/12/23 1026 96/60     Systolic BP Percentile --      Diastolic BP Percentile --      Pulse Rate 05/12/23 1026 (!) 110     Resp 05/12/23 1026 16     Temp 05/12/23 1026 100.2 F (37.9 C)     Temp Source 05/12/23 1026 Oral     SpO2 05/12/23 1026 95 %     Weight 05/12/23 1025 275 lb (124.7 kg)     Height 05/12/23 1025 5\' 6"  (1.676 m)     Head Circumference --      Peak Flow --      Pain Score 05/12/23 1024 8     Pain Loc --      Pain Education --      Exclude from Growth Chart --    No data found.  Updated Vital Signs BP 96/60 (BP Location: Left Arm)   Pulse (!) 110   Temp 100.2 F (37.9 C) (Oral)   Resp 16   Ht 5\' 6"  (1.676 m)   Wt 275 lb (124.7 kg)   SpO2 95%   BMI 44.39 kg/m   Visual Acuity Right Eye Distance:   Left Eye Distance:   Bilateral Distance:    Right Eye Near:   Left  Eye Near:    Bilateral Near:     Physical Exam Vitals and nursing note reviewed.  Constitutional:      General: He is awake. He is not in acute distress.    Appearance: Normal appearance. He is well-developed and well-groomed. He is not ill-appearing.  HENT:     Right Ear: Tympanic membrane, ear canal and external ear normal.     Left Ear: Tympanic membrane, ear canal and external ear normal.     Nose: Congestion and rhinorrhea present.     Mouth/Throat:     Mouth: Mucous membranes are moist.     Pharynx: Posterior oropharyngeal erythema present. No oropharyngeal exudate.  Cardiovascular:     Rate and Rhythm: Regular rhythm. Tachycardia present.  Pulmonary:     Effort: Pulmonary effort is normal.     Breath sounds: Normal breath sounds.  Musculoskeletal:        General: Normal range of motion.  Skin:    General: Skin is warm and dry.  Neurological:     Mental Status: He is alert.  Psychiatric:        Behavior: Behavior is cooperative.      UC Treatments / Results  Labs (all labs ordered are listed, but only  abnormal results are displayed) Labs Reviewed  POC COVID19/FLU A&B COMBO    EKG   Radiology No results found.  Procedures Procedures (including critical care time)  Medications Ordered in UC Medications - No data to display  Initial Impression / Assessment and Plan / UC Course  I have reviewed the triage vital signs and the nursing notes.  Pertinent labs & imaging results that were available during my care of the patient were reviewed by me and considered in my medical decision making (see chart for details).     Upon assessment congestion and rhinorrhea are present, mild erythema noted to pharynx.  Lungs clear to bilateral auscultation.  Mild tachycardia present likely related to presence of elevated temperature.  COVID and flu testing were negative.  Prescribed Tamiflu due to recent exposure and influenza-like symptoms.  Discussed over-the-counter medication as needed for symptoms.  Discussed return precautions. Final Clinical Impressions(s) / UC Diagnoses   Final diagnoses:  Influenza-like illness     Discharge Instructions      Your flu and COVID tests were negative today.  Due to your recent exposure to flu I believe you are a candidate for Tamiflu.  Start taking this twice daily for 5 days. You can continue taking TheraFlu as needed for symptoms.  This does have acetaminophen (tylenol) and it so do not take this with additional Tylenol, you can alternate this with 400 to 600 mg ibuprofen as needed for body aches headache, or fever. Also recommended Mucinex as needed for cough and congestion. Make sure you are staying hydrated and getting plenty of rest. Return here if your symptoms persist or worsen.    ED Prescriptions     Medication Sig Dispense Auth. Provider   oseltamivir (TAMIFLU) 75 MG capsule Take 1 capsule (75 mg total) by mouth every 12 (twelve) hours. 10 capsule Wynonia Lawman A, NP      PDMP not reviewed this encounter.   Wynonia Lawman A,  NP 05/12/23 1130

## 2023-05-12 NOTE — Discharge Instructions (Signed)
 Your flu and COVID tests were negative today.  Due to your recent exposure to flu I believe you are a candidate for Tamiflu.  Start taking this twice daily for 5 days. You can continue taking TheraFlu as needed for symptoms.  This does have acetaminophen (tylenol) and it so do not take this with additional Tylenol, you can alternate this with 400 to 600 mg ibuprofen as needed for body aches headache, or fever. Also recommended Mucinex as needed for cough and congestion. Make sure you are staying hydrated and getting plenty of rest. Return here if your symptoms persist or worsen.
